# Patient Record
Sex: Male | Born: 1950 | Race: White | Hispanic: No | Marital: Married | Smoking: Former smoker
Health system: Southern US, Community
[De-identification: ages and names within clinical notes are randomized; demographics above are authoritative.]

## PROBLEM LIST (undated history)

## (undated) DIAGNOSIS — I10 Essential (primary) hypertension: Secondary | ICD-10-CM

## (undated) DIAGNOSIS — C801 Malignant (primary) neoplasm, unspecified: Secondary | ICD-10-CM

## (undated) DIAGNOSIS — K5790 Diverticulosis of intestine, part unspecified, without perforation or abscess without bleeding: Secondary | ICD-10-CM

## (undated) DIAGNOSIS — E785 Hyperlipidemia, unspecified: Secondary | ICD-10-CM

## (undated) DIAGNOSIS — N200 Calculus of kidney: Secondary | ICD-10-CM

## (undated) HISTORY — DX: Hyperlipidemia, unspecified: E78.5

## (undated) HISTORY — PX: LOW ANTERIOR BOWEL RESECTION: SUR1240

## (undated) HISTORY — DX: Essential (primary) hypertension: I10

## (undated) HISTORY — DX: Diverticulosis of intestine, part unspecified, without perforation or abscess without bleeding: K57.90

## (undated) HISTORY — DX: Calculus of kidney: N20.0

## (undated) HISTORY — DX: Malignant (primary) neoplasm, unspecified: C80.1

## (undated) HISTORY — PX: COLONOSCOPY: SHX174

---

## 1997-12-29 LAB — FECAL OCCULT BLOOD, GUAIAC: Fecal Occult Blood: NEGATIVE

## 1998-06-26 ENCOUNTER — Encounter: Payer: Self-pay | Admitting: Family Medicine

## 1998-06-26 LAB — CONVERTED CEMR LAB: PSA: 1.3 ng/mL

## 2003-08-29 HISTORY — PX: OTHER SURGICAL HISTORY: SHX169

## 2003-10-28 ENCOUNTER — Ambulatory Visit (HOSPITAL_COMMUNITY): Admission: RE | Admit: 2003-10-28 | Discharge: 2003-10-28 | Payer: Self-pay | Admitting: Internal Medicine

## 2003-11-13 ENCOUNTER — Inpatient Hospital Stay (HOSPITAL_COMMUNITY): Admission: RE | Admit: 2003-11-13 | Discharge: 2003-11-20 | Payer: Self-pay | Admitting: General Surgery

## 2003-11-13 ENCOUNTER — Encounter (INDEPENDENT_AMBULATORY_CARE_PROVIDER_SITE_OTHER): Payer: Self-pay | Admitting: Specialist

## 2003-12-08 ENCOUNTER — Ambulatory Visit (HOSPITAL_COMMUNITY): Admission: RE | Admit: 2003-12-08 | Discharge: 2003-12-08 | Payer: Self-pay | Admitting: Hematology and Oncology

## 2004-03-05 ENCOUNTER — Ambulatory Visit: Payer: Self-pay | Admitting: Hematology and Oncology

## 2004-03-29 ENCOUNTER — Ambulatory Visit (HOSPITAL_COMMUNITY): Admission: RE | Admit: 2004-03-29 | Discharge: 2004-03-29 | Payer: Self-pay | Admitting: Hematology and Oncology

## 2004-07-06 ENCOUNTER — Ambulatory Visit: Payer: Self-pay | Admitting: Hematology and Oncology

## 2004-10-07 ENCOUNTER — Ambulatory Visit: Payer: Self-pay | Admitting: Internal Medicine

## 2004-10-26 ENCOUNTER — Ambulatory Visit: Payer: Self-pay | Admitting: Internal Medicine

## 2004-10-26 ENCOUNTER — Encounter (INDEPENDENT_AMBULATORY_CARE_PROVIDER_SITE_OTHER): Payer: Self-pay | Admitting: *Deleted

## 2004-11-30 ENCOUNTER — Ambulatory Visit: Payer: Self-pay | Admitting: Hematology and Oncology

## 2005-04-01 ENCOUNTER — Ambulatory Visit (HOSPITAL_COMMUNITY): Admission: RE | Admit: 2005-04-01 | Discharge: 2005-04-01 | Payer: Self-pay | Admitting: Hematology and Oncology

## 2005-04-06 ENCOUNTER — Ambulatory Visit: Payer: Self-pay | Admitting: Hematology and Oncology

## 2005-10-03 ENCOUNTER — Ambulatory Visit: Payer: Self-pay | Admitting: Hematology and Oncology

## 2005-10-05 LAB — CBC WITH DIFFERENTIAL/PLATELET
Eosinophils Absolute: 0.2 10*3/uL (ref 0.0–0.5)
MONO#: 0.4 10*3/uL (ref 0.1–0.9)
NEUT#: 3.6 10*3/uL (ref 1.5–6.5)
Platelets: 269 10*3/uL (ref 145–400)
RBC: 4.89 10*6/uL (ref 4.20–5.71)
RDW: 12.6 % (ref 11.2–14.6)
WBC: 5.8 10*3/uL (ref 4.0–10.0)
lymph#: 1.7 10*3/uL (ref 0.9–3.3)

## 2005-10-05 LAB — COMPREHENSIVE METABOLIC PANEL
ALT: 23 U/L (ref 0–40)
Albumin: 4.1 g/dL (ref 3.5–5.2)
CO2: 25 mEq/L (ref 19–32)
Chloride: 104 mEq/L (ref 96–112)
Glucose, Bld: 142 mg/dL — ABNORMAL HIGH (ref 70–99)
Potassium: 3.9 mEq/L (ref 3.5–5.3)
Sodium: 139 mEq/L (ref 135–145)
Total Protein: 7 g/dL (ref 6.0–8.3)

## 2005-10-05 LAB — CEA: CEA: 1.1 ng/mL (ref 0.0–5.0)

## 2005-10-29 LAB — HM COLONOSCOPY

## 2005-11-08 ENCOUNTER — Ambulatory Visit: Payer: Self-pay | Admitting: Internal Medicine

## 2005-11-22 ENCOUNTER — Ambulatory Visit: Payer: Self-pay | Admitting: Internal Medicine

## 2006-03-28 ENCOUNTER — Ambulatory Visit: Payer: Self-pay | Admitting: Hematology and Oncology

## 2006-03-31 LAB — CBC WITH DIFFERENTIAL/PLATELET
BASO%: 0.7 % (ref 0.0–2.0)
EOS%: 1.9 % (ref 0.0–7.0)
LYMPH%: 31.6 % (ref 14.0–48.0)
MCH: 31.4 pg (ref 28.0–33.4)
MCHC: 35.5 g/dL (ref 32.0–35.9)
MONO#: 0.3 10*3/uL (ref 0.1–0.9)
RBC: 4.95 10*6/uL (ref 4.20–5.71)
WBC: 6.7 10*3/uL (ref 4.0–10.0)
lymph#: 2.1 10*3/uL (ref 0.9–3.3)

## 2006-03-31 LAB — COMPREHENSIVE METABOLIC PANEL
ALT: 33 U/L (ref 0–53)
AST: 19 U/L (ref 0–37)
CO2: 25 mEq/L (ref 19–32)
Chloride: 103 mEq/L (ref 96–112)
Creatinine, Ser: 1.04 mg/dL (ref 0.40–1.50)
Sodium: 140 mEq/L (ref 135–145)
Total Bilirubin: 0.5 mg/dL (ref 0.3–1.2)
Total Protein: 7.5 g/dL (ref 6.0–8.3)

## 2006-03-31 LAB — CEA: CEA: 1.2 ng/mL (ref 0.0–5.0)

## 2006-04-04 ENCOUNTER — Ambulatory Visit (HOSPITAL_COMMUNITY): Admission: RE | Admit: 2006-04-04 | Discharge: 2006-04-04 | Payer: Self-pay | Admitting: Hematology and Oncology

## 2006-09-29 ENCOUNTER — Ambulatory Visit: Payer: Self-pay | Admitting: Hematology and Oncology

## 2006-10-03 LAB — COMPREHENSIVE METABOLIC PANEL
ALT: 24 U/L (ref 0–53)
AST: 17 U/L (ref 0–37)
Alkaline Phosphatase: 79 U/L (ref 39–117)
BUN: 14 mg/dL (ref 6–23)
Calcium: 9.4 mg/dL (ref 8.4–10.5)
Creatinine, Ser: 1.09 mg/dL (ref 0.40–1.50)
Total Bilirubin: 0.8 mg/dL (ref 0.3–1.2)

## 2006-10-03 LAB — CBC WITH DIFFERENTIAL/PLATELET
BASO%: 1.1 % (ref 0.0–2.0)
Basophils Absolute: 0.1 10*3/uL (ref 0.0–0.1)
EOS%: 2.4 % (ref 0.0–7.0)
HCT: 43.4 % (ref 38.7–49.9)
HGB: 15.1 g/dL (ref 13.0–17.1)
LYMPH%: 26.6 % (ref 14.0–48.0)
MCH: 31.1 pg (ref 28.0–33.4)
MCHC: 34.8 g/dL (ref 32.0–35.9)
MCV: 89.3 fL (ref 81.6–98.0)
MONO%: 5.9 % (ref 0.0–13.0)
NEUT%: 64 % (ref 40.0–75.0)
Platelets: 232 10*3/uL (ref 145–400)
lymph#: 1.7 10*3/uL (ref 0.9–3.3)

## 2006-11-27 ENCOUNTER — Ambulatory Visit: Payer: Self-pay | Admitting: Internal Medicine

## 2006-11-27 DIAGNOSIS — M545 Low back pain: Secondary | ICD-10-CM

## 2006-12-02 ENCOUNTER — Emergency Department (HOSPITAL_COMMUNITY): Admission: EM | Admit: 2006-12-02 | Discharge: 2006-12-02 | Payer: Self-pay | Admitting: Emergency Medicine

## 2007-03-08 ENCOUNTER — Encounter: Payer: Self-pay | Admitting: Family Medicine

## 2007-03-08 DIAGNOSIS — C20 Malignant neoplasm of rectum: Secondary | ICD-10-CM

## 2007-03-08 DIAGNOSIS — Z87442 Personal history of urinary calculi: Secondary | ICD-10-CM | POA: Insufficient documentation

## 2007-03-08 DIAGNOSIS — Z85048 Personal history of other malignant neoplasm of rectum, rectosigmoid junction, and anus: Secondary | ICD-10-CM | POA: Insufficient documentation

## 2007-03-09 ENCOUNTER — Ambulatory Visit: Payer: Self-pay | Admitting: Family Medicine

## 2007-03-09 DIAGNOSIS — M25519 Pain in unspecified shoulder: Secondary | ICD-10-CM

## 2007-03-12 ENCOUNTER — Encounter: Admission: RE | Admit: 2007-03-12 | Discharge: 2007-03-12 | Payer: Self-pay | Admitting: Family Medicine

## 2007-03-27 ENCOUNTER — Ambulatory Visit: Payer: Self-pay | Admitting: Hematology and Oncology

## 2007-03-29 ENCOUNTER — Ambulatory Visit (HOSPITAL_COMMUNITY): Admission: RE | Admit: 2007-03-29 | Discharge: 2007-03-29 | Payer: Self-pay | Admitting: Hematology and Oncology

## 2007-03-29 LAB — CBC WITH DIFFERENTIAL/PLATELET
Basophils Absolute: 0 10*3/uL (ref 0.0–0.1)
EOS%: 1.8 % (ref 0.0–7.0)
Eosinophils Absolute: 0.1 10*3/uL (ref 0.0–0.5)
HCT: 45 % (ref 38.7–49.9)
HGB: 16 g/dL (ref 13.0–17.1)
MCH: 31.3 pg (ref 28.0–33.4)
MCV: 88.2 fL (ref 81.6–98.0)
MONO%: 7.4 % (ref 0.0–13.0)
NEUT#: 3.4 10*3/uL (ref 1.5–6.5)
NEUT%: 59.9 % (ref 40.0–75.0)
RDW: 12.1 % (ref 11.2–14.6)

## 2007-03-29 LAB — COMPREHENSIVE METABOLIC PANEL
Albumin: 4 g/dL (ref 3.5–5.2)
Alkaline Phosphatase: 74 U/L (ref 39–117)
BUN: 13 mg/dL (ref 6–23)
Calcium: 9.1 mg/dL (ref 8.4–10.5)
Chloride: 98 mEq/L (ref 96–112)
Creatinine, Ser: 1.12 mg/dL (ref 0.40–1.50)
Glucose, Bld: 114 mg/dL — ABNORMAL HIGH (ref 70–99)
Potassium: 4.3 mEq/L (ref 3.5–5.3)

## 2007-04-05 ENCOUNTER — Encounter: Payer: Self-pay | Admitting: Family Medicine

## 2007-04-06 ENCOUNTER — Telehealth: Payer: Self-pay | Admitting: Family Medicine

## 2007-05-07 ENCOUNTER — Ambulatory Visit: Payer: Self-pay | Admitting: Internal Medicine

## 2007-05-18 ENCOUNTER — Encounter: Payer: Self-pay | Admitting: Family Medicine

## 2007-06-20 ENCOUNTER — Ambulatory Visit: Payer: Self-pay | Admitting: Family Medicine

## 2008-03-27 ENCOUNTER — Ambulatory Visit: Payer: Self-pay | Admitting: Hematology and Oncology

## 2008-03-31 ENCOUNTER — Ambulatory Visit (HOSPITAL_COMMUNITY): Admission: RE | Admit: 2008-03-31 | Discharge: 2008-03-31 | Payer: Self-pay | Admitting: Hematology and Oncology

## 2008-03-31 LAB — CBC WITH DIFFERENTIAL/PLATELET
Basophils Absolute: 0 10*3/uL (ref 0.0–0.1)
Eosinophils Absolute: 0.1 10*3/uL (ref 0.0–0.5)
HGB: 15.5 g/dL (ref 13.0–17.1)
MCV: 90.1 fL (ref 81.6–98.0)
MONO%: 6.9 % (ref 0.0–13.0)
NEUT#: 3.5 10*3/uL (ref 1.5–6.5)
Platelets: 252 10*3/uL (ref 145–400)
RDW: 12.7 % (ref 11.2–14.6)

## 2008-03-31 LAB — COMPREHENSIVE METABOLIC PANEL
Albumin: 3.8 g/dL (ref 3.5–5.2)
CO2: 26 mEq/L (ref 19–32)
Calcium: 9.7 mg/dL (ref 8.4–10.5)
Glucose, Bld: 110 mg/dL — ABNORMAL HIGH (ref 70–99)
Sodium: 139 mEq/L (ref 135–145)
Total Bilirubin: 0.8 mg/dL (ref 0.3–1.2)
Total Protein: 6.9 g/dL (ref 6.0–8.3)

## 2008-04-11 ENCOUNTER — Encounter: Payer: Self-pay | Admitting: Internal Medicine

## 2008-04-11 ENCOUNTER — Encounter: Payer: Self-pay | Admitting: Family Medicine

## 2008-09-08 ENCOUNTER — Ambulatory Visit: Payer: Self-pay | Admitting: Family Medicine

## 2008-09-08 DIAGNOSIS — E785 Hyperlipidemia, unspecified: Secondary | ICD-10-CM

## 2008-09-12 LAB — CONVERTED CEMR LAB
ALT: 38 units/L (ref 0–53)
AST: 27 units/L (ref 0–37)
Albumin: 4.1 g/dL (ref 3.5–5.2)
Alkaline Phosphatase: 81 units/L (ref 39–117)
Basophils Relative: 0.2 % (ref 0.0–3.0)
Bilirubin, Direct: 0 mg/dL (ref 0.0–0.3)
CO2: 31 meq/L (ref 19–32)
Cholesterol: 334 mg/dL — ABNORMAL HIGH (ref 0–200)
Eosinophils Absolute: 0.1 10*3/uL (ref 0.0–0.7)
GFR calc non Af Amer: 81.31 mL/min (ref >60)
Glucose, Bld: 81 mg/dL (ref 70–99)
HCT: 46.8 % (ref 39.0–52.0)
Hemoglobin: 15.9 g/dL (ref 13.0–17.0)
MCHC: 34.1 g/dL (ref 30.0–36.0)
MCV: 91.9 fL (ref 78.0–100.0)
Monocytes Absolute: 0.5 10*3/uL (ref 0.1–1.0)
Neutrophils Relative %: 59.3 % (ref 43.0–77.0)
PSA: 1.89 ng/mL (ref 0.10–4.00)
Potassium: 4 meq/L (ref 3.5–5.1)
Sodium: 143 meq/L (ref 135–145)
Total Bilirubin: 0.7 mg/dL (ref 0.3–1.2)
Total CHOL/HDL Ratio: 8

## 2008-10-20 ENCOUNTER — Ambulatory Visit: Payer: Self-pay | Admitting: Family Medicine

## 2008-10-22 LAB — CONVERTED CEMR LAB
ALT: 35 units/L (ref 0–53)
AST: 29 units/L (ref 0–37)
Cholesterol: 241 mg/dL — ABNORMAL HIGH (ref 0–200)
Direct LDL: 156.6 mg/dL
HDL: 41.1 mg/dL (ref >39.00)
VLDL: 65 mg/dL — ABNORMAL HIGH (ref 0.0–40.0)

## 2008-10-27 ENCOUNTER — Ambulatory Visit: Payer: Self-pay | Admitting: Internal Medicine

## 2008-11-13 ENCOUNTER — Ambulatory Visit: Payer: Self-pay | Admitting: Internal Medicine

## 2009-01-12 ENCOUNTER — Ambulatory Visit: Payer: Self-pay | Admitting: Family Medicine

## 2009-01-13 LAB — CONVERTED CEMR LAB
AST: 26 units/L (ref 0–37)
Cholesterol: 332 mg/dL — ABNORMAL HIGH (ref 0–200)
Total CHOL/HDL Ratio: 8

## 2009-04-01 ENCOUNTER — Ambulatory Visit (HOSPITAL_BASED_OUTPATIENT_CLINIC_OR_DEPARTMENT_OTHER): Payer: 59 | Admitting: Hematology and Oncology

## 2009-04-03 ENCOUNTER — Ambulatory Visit (HOSPITAL_COMMUNITY): Admission: RE | Admit: 2009-04-03 | Discharge: 2009-04-03 | Payer: Self-pay | Admitting: Hematology and Oncology

## 2009-04-03 LAB — COMPREHENSIVE METABOLIC PANEL
AST: 27 U/L (ref 0–37)
Alkaline Phosphatase: 84 U/L (ref 39–117)
BUN: 10 mg/dL (ref 6–23)
Creatinine, Ser: 1.11 mg/dL (ref 0.40–1.50)
Total Bilirubin: 0.8 mg/dL (ref 0.3–1.2)

## 2009-04-03 LAB — CBC WITH DIFFERENTIAL/PLATELET
Basophils Absolute: 0 10*3/uL (ref 0.0–0.1)
EOS%: 1.4 % (ref 0.0–7.0)
HCT: 44.3 % (ref 38.4–49.9)
HGB: 15.4 g/dL (ref 13.0–17.1)
MCH: 31.6 pg (ref 27.2–33.4)
MCV: 90.7 fL (ref 79.3–98.0)
MONO%: 7.5 % (ref 0.0–14.0)
NEUT%: 64.4 % (ref 39.0–75.0)

## 2009-04-09 ENCOUNTER — Encounter: Payer: Self-pay | Admitting: Family Medicine

## 2009-07-08 ENCOUNTER — Ambulatory Visit: Payer: Self-pay | Admitting: Family Medicine

## 2010-03-19 ENCOUNTER — Other Ambulatory Visit: Payer: Self-pay | Admitting: Hematology and Oncology

## 2010-03-19 DIAGNOSIS — C20 Malignant neoplasm of rectum: Secondary | ICD-10-CM

## 2010-03-20 ENCOUNTER — Encounter: Payer: Self-pay | Admitting: Hematology and Oncology

## 2010-03-21 ENCOUNTER — Encounter: Payer: Self-pay | Admitting: Hematology and Oncology

## 2010-03-31 NOTE — Assessment & Plan Note (Signed)
Summary: SINUS SYMPTOMS   Vital Signs:  Patient profile:   60 year old male Temp:     97.7 degrees F oral Pulse rate:   68 / minute Pulse rhythm:   regular BP sitting:   142 / 90  (left arm) Cuff size:   regular  Vitals Entered By: Sydell Axon LPN (Jul 08, 2009 11:20 AM) CC: Sore throat, chest congestion , productive cough/light green, symptoms started 4 days ago, declined to weigh   History of Present Illness: Pt here for congestion from the neck down. He has no fever or chills, He hasn';t felt bad except chest congestion from five days ago and ST three days ago. No ear pain, no rhinitis, some cough with moinimal production light green production first thing in the morning, mild SOb, noi N/V. He has taken Dayquil and throat spray.  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 Take 1 Tablet By Mouth Once A Day 2)  Adult Aspirin Ec Low Strength 81 Mg  Tbec (Aspirin) .... Take 1 Tablet By Mouth Once A Day  Allergies: 1)  ! Lipitor 2)  ! Pravachol  Physical Exam  General:  Well-developed,well-nourished,in no acute distress; alert,appropriate and cooperative throughout examination, does not appear congested. Head:  normocephalic, atraumatic, and no abnormalities observed.  Sinuses NT. Eyes:  Conjunctiva clear bilaterally, no icterus. Ears:  External ear exam shows no significant lesions or deformities.  Otoscopic examination reveals clear canals, tympanic membranes are intact bilaterally without bulging, retraction, inflammation or discharge. Hearing is grossly normal  bilaterally. Nose:  External nasal examination shows no deformity or inflammation. Nasal mucosa are pink and moist without lesions or exudates. Mouth:  Oral mucosa and oropharynx without lesions or exudates.  Teeth in good repair. Neck:  supple with full rom and no masses or thyromegally, no JVD or carotid bruit  Lungs:  Normal respiratory effort, chest expands symmetrically. Lungs are clear to auscultation, no crackles or wheezes. Heart:  Normal rate and regular rhythm. S1 and S2 normal without gallop, murmur, click, rub or other extra sounds.   Impression & Recommendations:  Problem # 1:  URI (ICD-465.9) Assessment New  See instructions. His updated medication list for this problem includes:    Adult Aspirin Ec Low Strength 81 Mg Tbec (Aspirin) .Marland Kitchen... Take 1 tablet by mouth once a day    Dayquil Multi-symptom 30-325-10 Mg/23ml Liqd (Pseudoephedrine-apap-dm) .Marland Kitchen... As needed    Tessalon 200 Mg Caps (Benzonatate) ..... One tab by mouth three times a day  Instructed on symptomatic treatment. Call if symptoms persist or worsen.   Complete Medication List: 1)  Bl Essential One Daily Tabs (Multiple vitamin) .... Take 1 tablet by mouth once a day 2)  Adult Aspirin Ec Low Strength 81 Mg Tbec (Aspirin) .... Take 1 tablet by mouth once a day 3)  Dayquil Multi-symptom 30-325-10 Mg/63ml Liqd (Pseudoephedrine-apap-dm) .... As needed 4)  Zithromax Z-pak 250 Mg Tabs (Azithromycin) .... As dir 5)  Tessalon 200 Mg Caps (Benzonatate) .... One tab by mouth three times a day  Patient Instructions: 1)  Take Guaifenesin by [facility] and getting MUCOUS RELIEF EXPECTORANT (400mg ), take 11/2 tabs by mouth AM and NOON. 2)  Drink lots of fluids anytime taking Guaifenesin.  3)  Tyl ES 2 tabs by mouth three times a day. 4)  Gargle with warm salt water every half hour for two days. 5)  Keep lozenge in mouth 6)  Take Tessalon to blunt cough. 7)  Only take Zithromax if sxs  worsen. Prescriptions: TESSALON 200 MG CAPS (BENZONATATE) one tab by mouth three times a day  #30 x 0   Entered and Authorized by:   Shaune Leeks MD   Signed by:   Shaune Leeks MD on 07/08/2009   Method used:   [facility]        [facility]  [address] ([facility]ail[facility]       [address]       [place], [place]  [address]       Ph: [phone] or [phone]       Fax: [phone]   RxID:   [id number] ZITHROMAX Z-PAK 250 MG TABS (AZITHROMYCIN) as dir  #1 pak x 0   Entered and Authorized by:   Shaune Leeks MD   Signed by:   Shaune Leeks MD on 07/08/2009   Method used:   [facility]        [facility]  [address] (retail)       [address]       [place], [place]  [address]       Ph: [phone] or [phone]       Fax: [phone]   RxID:   [id number]   Current Allergies (reviewed today): ! LIPITOR ! PRAVACHOL

## 2010-03-31 NOTE — Letter (Signed)
[facility]   Imported By: Lanelle Bal 04/20/2009 07:52:14  _____________________________________________________________________  External Attachment:    Type:   Image     Comment:   External Document

## 2010-04-02 ENCOUNTER — Other Ambulatory Visit: Payer: Self-pay | Admitting: Hematology and Oncology

## 2010-04-02 ENCOUNTER — Ambulatory Visit (HOSPITAL_COMMUNITY)
Admission: RE | Admit: 2010-04-02 | Discharge: 2010-04-02 | Disposition: A | Discharge status: Home / Self Care | Payer: 59 | Source: Ambulatory Visit | Attending: Hematology and Oncology | Admitting: Hematology and Oncology

## 2010-04-02 ENCOUNTER — Ambulatory Visit: Payer: 59 | Admitting: Hematology and Oncology

## 2010-04-02 DIAGNOSIS — J984 Other disorders of lung: Secondary | ICD-10-CM | POA: Insufficient documentation

## 2010-04-02 DIAGNOSIS — M5137 Other intervertebral disc degeneration, lumbosacral region: Secondary | ICD-10-CM | POA: Insufficient documentation

## 2010-04-02 DIAGNOSIS — M51379 Other intervertebral disc degeneration, lumbosacral region without mention of lumbar back pain or lower extremity pain: Secondary | ICD-10-CM | POA: Insufficient documentation

## 2010-04-02 DIAGNOSIS — Z9049 Acquired absence of other specified parts of digestive tract: Secondary | ICD-10-CM | POA: Insufficient documentation

## 2010-04-02 DIAGNOSIS — N400 Enlarged prostate without lower urinary tract symptoms: Secondary | ICD-10-CM | POA: Insufficient documentation

## 2010-04-02 DIAGNOSIS — C20 Malignant neoplasm of rectum: Secondary | ICD-10-CM

## 2010-04-02 DIAGNOSIS — Z85048 Personal history of other malignant neoplasm of rectum, rectosigmoid junction, and anus: Secondary | ICD-10-CM

## 2010-04-02 LAB — CMP (CANCER CENTER ONLY)
ALT(SGPT): 37 U/L (ref 10–47)
AST: 28 U/L (ref 11–38)
Calcium: 9.3 mg/dL (ref 8.0–10.3)
Creat: 1 mg/dl (ref 0.6–1.2)
Glucose, Bld: 120 mg/dL — ABNORMAL HIGH (ref 73–118)
Potassium: 4.4 mEq/L (ref 3.3–4.7)
Total Bilirubin: 0.6 mg/dl (ref 0.20–1.60)

## 2010-04-02 LAB — CBC WITH DIFFERENTIAL/PLATELET
Basophils Absolute: 0.1 10*3/uL (ref 0.0–0.1)
EOS%: 2.8 % (ref 0.0–7.0)
Eosinophils Absolute: 0.2 10*3/uL (ref 0.0–0.5)
HCT: 46.3 % (ref 38.4–49.9)
HGB: 16 g/dL (ref 13.0–17.1)
LYMPH%: 32.3 % (ref 14.0–49.0)
MCH: 30.9 pg (ref 27.2–33.4)
MCV: 89.6 fL (ref 79.3–98.0)
MONO%: 7.9 % (ref 0.0–14.0)
NEUT#: 3.4 10*3/uL (ref 1.5–6.5)
NEUT%: 56 % (ref 39.0–75.0)
Platelets: 255 10*3/uL (ref 140–400)

## 2010-04-02 MED ORDER — IOHEXOL 300 MG/ML  SOLN
100.0000 mL | Freq: Once | INTRAMUSCULAR | Status: AC | PRN
  Administered 2010-04-02: 100 mL via INTRAVENOUS

## 2010-04-23 ENCOUNTER — Other Ambulatory Visit: Payer: Self-pay | Admitting: Hematology and Oncology

## 2010-04-23 ENCOUNTER — Encounter: Payer: Self-pay | Admitting: Family Medicine

## 2010-04-23 ENCOUNTER — Encounter (HOSPITAL_BASED_OUTPATIENT_CLINIC_OR_DEPARTMENT_OTHER): Payer: 59 | Admitting: Hematology and Oncology

## 2010-04-23 DIAGNOSIS — C20 Malignant neoplasm of rectum: Secondary | ICD-10-CM

## 2010-04-23 DIAGNOSIS — Z85048 Personal history of other malignant neoplasm of rectum, rectosigmoid junction, and anus: Secondary | ICD-10-CM

## 2010-05-11 NOTE — Letter (Signed)
Summary: [facility]  [facility]   Imported By: Lanelle Bal 05/03/2010 11:36:43  _____________________________________________________________________  External Attachment:    Type:   Image     Comment:   External Document

## 2010-07-13 NOTE — Assessment & Plan Note (Signed)
[name]                         GASTROENTEROLOGY OFFICE NOTE   NAME:[name], [name]                       MRN:          [id number]  DATE:05/07/2007                            DOB:          10-Jun-1950    REASON FOR CONSULTATION:  Rectal exam.   HISTORY:  This is a 60 year old white male with a history of  adenocarcinoma of the rectum, status post low anterior resection on  November 13, 2003.  His disease was stage I.  Followup to date shows no  evidence of recurrent disease.  His last complete colonoscopy was on  November 22, 2005.  Evaluation revealed diverticulosis, and a  diminutive colon polyp, as well as hemorrhoids.  Followup in 3 years  recommended.  The patient recently saw Dr. Dalene Reynolds on April 05, 2007.  At that time she stated the patient needed a rectal exam, so he is  referred here.  The patient denies abdominal pain, change in bowel  habits or bleeding.  He does have what sounds like an external  hemorrhoid tag which he finds a bit bothersome.   He has no known drug allergies.   CURRENT MEDICATIONS:  Aspirin, multivitamin, and Naproxen.   PAST MEDICAL HISTORY:  1. Hyperlipidemia.  2. Kidney stones.  3. Rectal cancer.   His only surgery is his low anterior resection.   FAMILY HISTORY:  Remarkable for father with colon cancer diagnosed in  his 24s.  He is with the [employer] of [employer].  He is married.   PHYSICAL EXAMINATION:  GENERAL:  Finds a well-appearing male in no acute  distress.  He is alert and oriented.  VITAL SIGNS:  Blood pressure is 128/82, heart rate is 66, weight is 199  pounds.  LUNGS:  Clear.  HEART:  Regular.  ABDOMEN:  Soft without tenderness, mass or hernia.  RECTAL:  Reveals large non-inflamed external hemorrhoid tag.  Stool is  firm, brown, hemoccult negative.  No mass or tenderness.  Prostate is  normal.   IMPRESSION:  1. History of rectal cancer, stage I, status post low  anterior      resection.  No evidence of recurrent disease.  2. History of internal and external hemorrhoids. Symptomatic external      tag.  3. Incidental diverticulosis.   RECOMMENDATIONS:  1. Keep plans for surveillance colonoscopy in September 2010.  2. The patient will call Dr. Derrell Reynolds regarding possible removal of the      external hemorrhoid tag.  3. Ongoing general medical care with Dr. Milinda Reynolds.     [name]. William Goodell, MD  Electronically Signed    JNP/MedQ  DD: 05/07/2007  DT: 05/07/2007  Job #: [id number]   cc:   [name], M.D.  [name]. [name], M.D.  William A. William Antis, MD

## 2010-07-16 NOTE — Op Note (Signed)
NAME:  [name], [name] NO.:  [id number]   MEDICAL RECORD NO.:  [id number]                   PATIENT TYPE:  INP   LOCATION:  0002                                 FACILITY:  [facility]   PHYSICIAN:  Angelia Mould. Derrell Lolling, M.D.             DATE OF BIRTH:  [birth year]   DATE OF PROCEDURE:  DATE OF DISCHARGE:                                 OPERATIVE REPORT   PREOPERATIVE DIAGNOSIS:  Carcinoma of the midrectum.   POSTOPERATIVE DIAGNOSIS:  Carcinoma of the midrectum.   OPERATION PERFORMED:  Low anterior resection of the rectum with  coloproctostomy (29 mm EEA stapler).   SURGEON:  Angelia Mould. Derrell Lolling, M.D.   FIRST ASSISTANT:  Carlester Kasparek. Zachery Dakins, M.D.   OPERATIVE INDICATION:  This is a 60 year old white man who noticed a slight  change in his bowel habits and was found to have Hemoccult-positive stools,  who was otherwise feeling well.  A colonoscopy showed a malignant-appearing  tumor at 11 cm from the anal verge and having a length of 4 cm.  The tumor  was not obstructing.  Biopsy showed adenocarcinoma.  A CT scan of the  abdomen and pelvis was unremarkable, no adenopathy or mass was seen.  I  performed a rigid proctoscopy in the office and found that the tumor was at  about 12-13 cm.  The patient underwent elective bowel prep at home and is  brought to the operating room electively for resection.   OPERATIVE FINDINGS:  The patient had a 4-5 cm palpable mass in the rectum  just at and below the peritoneal reflection.  This did not appear to have  invaded through the full thickness of the wall of the colon.  There was no  palpable adenopathy.  Both ureters were seen and were easily identified and  preserved.  The liver felt normal.  There was no ascites.  The small bowel  felt normal.   OPERATIVE TECHNIQUE:  Following the induction of general endotracheal  anesthesia, a Foley catheter was inserted.  A nasogastric tube was inserted.  The patient placed in a  modified dorsal lithotomy position.  The abdomen and  the perineum were prepped and draped in a sterile fashion.  A low midline  laparotomy incision was made.  The abdomen was entered and explored with  findings as described above.  Self-retaining retractors were placed.  I  mobilized the sigmoid colon from its lateral peritoneal attachments with  sharp dissection.  I isolated a portion of the mid- to distal sigmoid colon  for transection and cleaned this off and transected it with the GIA stapling  device.  I scored the mesentery directly back to the periaortic area and  then down the sides into the pelvis.  Some of the more superficial  mesenteric vessels were isolated, clamped, and divided and ligated with 2-0  silk ties.  The superior hemorrhoidal vessels were isolated, clamped,  divided,  and suture ligated with 2-0 silk suture ligature and then ligated  with 2-0 silk tie.  I identified both the left ureter and the right ureter  and made sure that the dissection was carried out just medial to these  structures.  I carried the dissection down into the hollow of the sacrum  using the Harmonic scalpel.  I divided some of the lateral middle  hemorrhoidal attachments with the Harmonic scalpel.  The peritoneum was then  scored with the electrocautery anteriorly to further mobilize the rectum up  out of the pelvis.  We continued this dissection down into the pelvis.  His  pelvis was quite narrow, and we had to take small steps to do this.  We  ultimately isolated the rectum and the rectal mesentery about 5 cm below the  tumor.  We cleaned the mesentery off of the rectum using the Harmonic  scalpel.  When we had the rectum completely skeletonized, we took a look and  made sure we knew where the distal end of the tumor was.  We transected the  rectum about 4-5 cm distal to the tumor using the Ethicon contour stapling  device.  This worked well.  I took the specimen to a side table and opened   the specimen.  I measured the distal margin, and it was 3.0 cm.  I felt this  was more than adequate.  The rectum and its attached mesentery was then sent  to the lab for routine histology.   I changed my gloves and irrigated out the pelvis.  There really was not much  bleeding at all at this point.  I took the sigmoid colon where it had been  transected, and I amputated the staple line.  A pursestring suture of 2-0  Prolene was placed in the mid- to distal sigmoid.  I measured with the  dilators and found that it would admit a 29 mm stapler.  I inserted the  anvil into the sigmoid colon end and tied the pursestring suture down, and  this worked well.   At this point Dr. Zachery Dakins went below and inspected the perianal area and  the rectal stump.  He dilated the anal sphincters gently.  He inserted a  proctoscope and insufflated with air, and there was no air leak from the  stump.  He then inserted the 29 mm EEA stapler.  Under direct vision I  positioned that along with his assistance into the pelvis.  Once this was  centered on the rectal stump, he opened the stapler and the spike passed  easily through the rectal wall.  I secured the anvil onto this.  I checked  for orientation to make sure there was no twisting.  Dr. Zachery Dakins then  closed the stapler, fired it, opened it, and removed the stapler.  We took  the two tissue doughnuts out of the stapler, and they were good, thick,  complete doughnuts.  We sent the distal doughnut and labeled it as distal  margin.   Dr. Zachery Dakins then performed a rigid proctoscopy and he said the staple line  looked good all the way around, there was no bleeding. We put some saline in  the pelvis and when we insufflated the rectum, there was no air leak.  We  sucked all the air out.   We then changed our instruments and gloves.  The pelvis was irrigated copiously with about 2 L of saline.  There was no bleeding whatsoever.  All  of the laparotomy pads  were removed.  We positioned the nasogastric tube.  The small bowel and omentum were returned to their anatomic positions.  The  midline fascia was closed with a running suture of #1 PDS and the skin  closed with skin staples.  Clean bandages were placed and the patient was  taken to the recovery room in stable condition.  Estimated blood loss was  about 300 mL.  Complications:  None.  Sponge, needle, and instrument counts  were correct.                                               Angelia Mould. Derrell Lolling, M.D.    HMI/MEDQ  D:  11/13/2003  T:  11/13/2003  Job:  [id number]   cc:   Marne A. Milinda Antis, M.D. [facility]   John N. Marina Goodell, M.D. [facility]

## 2010-07-16 NOTE — Discharge Summary (Signed)
NAME:  [name], [name] NO.:  [id number]   MEDICAL RECORD NO.:  [id number]          PATIENT TYPE:  INP   LOCATION:  0342                         [facility]AC[facility]  [facility]   PHYSICIAN:  Angelia Mould. Derrell Lolling, M.D.DATE OF BIRTH:  11/15/1950   DATE OF ADMISSION:  11/13/2003  DATE OF DISCHARGE:  11/20/2003                                 DISCHARGE SUMMARY   FINAL DIAGNOSES:  Adenocarcinoma of the rectum, stage T2, N0.   OPERATION PERFORMED:  Low anterior resection of rectum with primary  anastomosis, November 13, 2003.   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  He noticed some change in his stool with change in consistency and  mucus.  He was found to have Hemoccult positive stools.  He has a family  history of colon cancer in his father. He underwent a colonoscopy on October 24, 2003 and there was a malignant appearing tumor at 11 cm and this  appeared to be circumferential but not obstructing.  The biopsy showed  invasive adenocarcinoma.  A CT scan of the abdomen and pelvis was also  performed and everything looked fine. There was no large tumor mass.  He was  evaluated for consideration of elective resection.   PHYSICAL EXAMINATION:  GENERAL:  Extremely healthy appearing thin, muscular  gentleman in no distress.  NECK:  Revealed no adenopathy or mass.  LUNGS:  Clear to auscultation.  HEART:  Regular rate and rhythm, no murmur.  ABDOMEN:  Soft and nontender, no palpable mass.  Liver and spleen not  enlarged.   A rigid proctoscopy was carried out which revealed the lower edge of the  tumor to be at about 12-13 cm.  I did not feel that he required mandatory  preop radiation therapy.   Surgery was discussed. He was brought to the hospital electively.   HOSPITAL COURSE:  The patient was taken to the operating room electively on  November 13, 2003.  He underwent exploration and a low anterior resection.  Apparent that his tumor was  just at and slightly below the peritoneal  reflection but was quite mobile, it was not that large.  We resected this  and performed anastomosis with a 29 mm EEA stapler.  The surgery was  uneventful.   The pathology report showed an invasive moderately differentiated  adenocarcinoma which invaded the muscularis propria and extended close but  not into the pericolonic adipose tissue. Margins of resection were free.  The tumor size was 5.9 cm in maximal dimension. Twenty lymph nodes were  examined and all 20 lymph nodes were negative. This was staged as a T2, N0  tumor.   Postoperatively, the patient did well.  He was advised of his pathology  report that was discussed with him. He was seen briefly in the hospital by  Dr. Arlan Organ of the medical oncology group and arrangements were made  for outpatient followup.   The patient progressed slowly but steadily in his diet and activities.  Nasogastric tube and Foley catheter were removed on the second postoperative  day and he  did well. He progressed slowly in his diet and activities and  resumed bowel function.   He was ready to go home on November 20, 2003. At that time, he had had  bowel movements and was tolerating a diet and wasn't having any cramps or  abdominal distention. His abdominal exam was unremarkable and the wound was  healing well. He was given a prescription for Vicodin for pain. He was asked  to followup with me in the office in about 10-14 days.  We took the staples  out of his wound and Steri-Stripped his wound.      HMI/MEDQ  D:  12/04/2003  T:  12/04/2003  Job:  [id number]   cc:   Marne A. Milinda Antis, M.D. [facility]   John N. Marina Goodell, M.D. [facility]

## 2010-12-09 LAB — URINALYSIS, ROUTINE W REFLEX MICROSCOPIC
Nitrite: NEGATIVE
Protein, ur: NEGATIVE
Specific Gravity, Urine: 1.021
Urobilinogen, UA: 1

## 2011-03-09 ENCOUNTER — Encounter: Payer: Self-pay | Admitting: Family Medicine

## 2011-03-09 ENCOUNTER — Ambulatory Visit (INDEPENDENT_AMBULATORY_CARE_PROVIDER_SITE_OTHER): Payer: 59 | Admitting: Family Medicine

## 2011-03-09 DIAGNOSIS — J111 Influenza due to unidentified influenza virus with other respiratory manifestations: Secondary | ICD-10-CM

## 2011-03-09 MED ORDER — OSELTAMIVIR PHOSPHATE 75 MG PO CAPS: 75.0000 mg | ORAL_CAPSULE | Freq: Two times a day (BID) | ORAL | Status: AC

## 2011-03-09 NOTE — Progress Notes (Signed)
SUBJECTIVE:  [name] is a 61 y.o. male who complains of coryza, myalgias and fever, TMax 102 for 3 days. He denies a history of chest pain, shortness of breath and vomiting and denies a history of asthma. Patient denies smoke cigarettes.   He did receive his flu shot this year.  OBJECTIVE: BP 138/80  Pulse 110  Temp(Src) 98 F (36.7 C) (Oral)  Wt 194 lb 4 oz (88.111 kg)  He appears well, vital signs are as noted. Ears normal.  Throat and pharynx normal.  Neck supple. No adenopathy in the neck. Nose is congested. Sinuses non tender. The chest is clear, without wheezes or rales.  ASSESSMENT:  influenza  PLAN: Tamiflu 75 mg twice daily x 5 days. Symptomatic therapy suggested: push fluids, rest and return office visit prn if symptoms persist or worsen. Lack of antibiotic effectiveness discussed with him. Call or return to clinic prn if these symptoms worsen or fail to improve as anticipated.

## 2011-03-12 ENCOUNTER — Telehealth: Payer: Self-pay | Admitting: Hematology and Oncology

## 2011-03-12 NOTE — Telephone Encounter (Signed)
S/w the pt and he is aware of his feb 2013 appts °

## 2011-04-15 ENCOUNTER — Other Ambulatory Visit: Payer: 59 | Admitting: Lab

## 2011-04-15 ENCOUNTER — Ambulatory Visit (HOSPITAL_COMMUNITY)
Admission: RE | Admit: 2011-04-15 | Discharge: 2011-04-15 | Disposition: A | Discharge status: Home / Self Care | Payer: 59 | Source: Ambulatory Visit | Attending: Hematology and Oncology | Admitting: Hematology and Oncology

## 2011-04-15 DIAGNOSIS — R918 Other nonspecific abnormal finding of lung field: Secondary | ICD-10-CM | POA: Insufficient documentation

## 2011-04-15 DIAGNOSIS — K4090 Unilateral inguinal hernia, without obstruction or gangrene, not specified as recurrent: Secondary | ICD-10-CM | POA: Insufficient documentation

## 2011-04-15 DIAGNOSIS — Z9049 Acquired absence of other specified parts of digestive tract: Secondary | ICD-10-CM | POA: Insufficient documentation

## 2011-04-15 DIAGNOSIS — I700 Atherosclerosis of aorta: Secondary | ICD-10-CM | POA: Insufficient documentation

## 2011-04-15 DIAGNOSIS — N400 Enlarged prostate without lower urinary tract symptoms: Secondary | ICD-10-CM | POA: Insufficient documentation

## 2011-04-15 DIAGNOSIS — K5730 Diverticulosis of large intestine without perforation or abscess without bleeding: Secondary | ICD-10-CM | POA: Insufficient documentation

## 2011-04-15 DIAGNOSIS — C20 Malignant neoplasm of rectum: Secondary | ICD-10-CM

## 2011-04-15 DIAGNOSIS — Z980 Intestinal bypass and anastomosis status: Secondary | ICD-10-CM | POA: Insufficient documentation

## 2011-04-15 DIAGNOSIS — I2510 Atherosclerotic heart disease of native coronary artery without angina pectoris: Secondary | ICD-10-CM | POA: Insufficient documentation

## 2011-04-15 LAB — CBC WITH DIFFERENTIAL/PLATELET
BASO%: 0.3 % (ref 0.0–2.0)
Basophils Absolute: 0 10*3/uL (ref 0.0–0.1)
EOS%: 2 % (ref 0.0–7.0)
MCH: 30.5 pg (ref 27.2–33.4)
MCHC: 34.2 g/dL (ref 32.0–36.0)
MCV: 89.4 fL (ref 79.3–98.0)
MONO%: 6.3 % (ref 0.0–14.0)
RBC: 5.08 10*6/uL (ref 4.20–5.82)
RDW: 12.7 % (ref 11.0–14.6)

## 2011-04-15 LAB — CMP (CANCER CENTER ONLY)
ALT(SGPT): 38 U/L (ref 10–47)
AST: 28 U/L (ref 11–38)
BUN, Bld: 14 mg/dL (ref 7–22)
Calcium: 9 mg/dL (ref 8.0–10.3)
Chloride: 100 mEq/L (ref 98–108)
Creat: 1 mg/dl (ref 0.6–1.2)
Total Bilirubin: 0.6 mg/dl (ref 0.20–1.60)

## 2011-04-15 MED ORDER — IOHEXOL 300 MG/ML  SOLN
100.0000 mL | Freq: Once | INTRAMUSCULAR | Status: AC | PRN
  Administered 2011-04-15: 100 mL via INTRAVENOUS

## 2011-04-22 ENCOUNTER — Telehealth: Payer: Self-pay | Admitting: Hematology and Oncology

## 2011-04-22 ENCOUNTER — Ambulatory Visit (HOSPITAL_BASED_OUTPATIENT_CLINIC_OR_DEPARTMENT_OTHER): Payer: 59 | Admitting: Hematology and Oncology

## 2011-04-22 VITALS — BP 142/89 | HR 87 | Temp 98.1°F | Wt 196.5 lb

## 2011-04-22 DIAGNOSIS — Z85048 Personal history of other malignant neoplasm of rectum, rectosigmoid junction, and anus: Secondary | ICD-10-CM

## 2011-04-22 DIAGNOSIS — C20 Malignant neoplasm of rectum: Secondary | ICD-10-CM

## 2011-04-22 NOTE — Progress Notes (Signed)
CC:   [name]. [name], M.D. [name]. William Goodell, MD  IDENTIFYING STATEMENT:  The patient is a 61 year old man with rectal cancer who presents for followup.  INTERVAL HISTORY:  [name] has had no concerns since his last followup visit a year ago.  Of note, his last colonoscopy was on 11/13/2008 that was essentially unremarkable.  He has had no loss in weight.  He has had no change in bowel function.  He has had no rectal bleeding.  I reviewed his CT scan of the chest, abdomen and pelvis on 04/15/2011. There was a 6 mm ground glass nodule in the posterior right upper lobe that remained stable with no suspicious pulmonary nodules or adenopathy. The abdomen and pelvis showed no evidence of metastatic disease.  MEDICATIONS:  Reviewed and updated.  Past medical history, family history and social history unchanged.  REVIEW OF SYSTEMS:  A 10 point review of systems negative.  PHYSICAL EXAMINATION:  General:  The patient is a well-appearing, well- nourished man in no distress.  Vitals:  Pulse 87, blood pressure 142/89, temperature 98.1, respirations 20, weight 196.5 pounds.  HEENT:  Head is atraumatic, normocephalic.  Sclerae anicteric.  Pupils equal, round, reactive to light.  Mouth moist without ulceration, thrush or lesions. Neck:  Supple without adenopathy.  Chest:  Demonstrates good entry bilaterally and clear to both percussion and auscultation. Cardiovascular:  Reveals first and second heart sounds present without added sounds or murmurs.  Abdomen:  Soft, nontender.  No masses.  No hepatomegaly.  Bowel sounds present.  Extremities:  No edema.  Pulses present and symmetrical.  CNS:  Nonfocal.  Lymph nodes:  No adenopathy.  LAB DATA:  On 04/15/2011 white cell count 7.1, hemoglobin 15.5, hematocrit 45.4, platelets 235.  Sodium 146, potassium 4.5, chloride 100, CO2 30, BUN 14, creatinine 1, glucose 116, T bilirubin 0.6, alkaline phosphatase 97, AST 28, ALT 38, calcium 9.  CEA 0.5.   Results of CTs as in interval history.  IMPRESSION AND PLAN:  [name] is a 61 year old man who is status post low anterior resection for stage I (T2 N0 M0) moderately differentiated adenocarcinoma of the rectum on 11/13/2003.  None of the 20 lymph nodes sampled had evidence of tumor.  His current exam and CT scans indicate no evidence of recurrence.  He is 6 years post diagnosis and will continue surveillance on an annual basis.  He follows up in a year's time with a CT scan and blood work.  He is due for colonoscopy in October of this year.    ______________________________ Laurice Record, M.D. LIO/MEDQ  D:  04/22/2011  T:  04/22/2011  Job:  [id number]

## 2011-04-22 NOTE — Progress Notes (Signed)
This office note has been dictated.

## 2011-04-22 NOTE — Telephone Encounter (Signed)
Called [facility], talked to [facility] to schedule colonoscopy, she informed me that pt will be called for the appt on September and pt aware.

## 2011-06-14 ENCOUNTER — Ambulatory Visit (INDEPENDENT_AMBULATORY_CARE_PROVIDER_SITE_OTHER): Payer: 59 | Admitting: Family Medicine

## 2011-06-14 ENCOUNTER — Encounter: Payer: Self-pay | Admitting: Family Medicine

## 2011-06-14 VITALS — BP 130/80 | HR 61 | Temp 97.8°F | Ht 72.0 in | Wt 193.0 lb

## 2011-06-14 DIAGNOSIS — C20 Malignant neoplasm of rectum: Secondary | ICD-10-CM

## 2011-06-14 DIAGNOSIS — R7303 Prediabetes: Secondary | ICD-10-CM | POA: Insufficient documentation

## 2011-06-14 DIAGNOSIS — Z125 Encounter for screening for malignant neoplasm of prostate: Secondary | ICD-10-CM

## 2011-06-14 DIAGNOSIS — R739 Hyperglycemia, unspecified: Secondary | ICD-10-CM

## 2011-06-14 DIAGNOSIS — E785 Hyperlipidemia, unspecified: Secondary | ICD-10-CM

## 2011-06-14 DIAGNOSIS — R7309 Other abnormal glucose: Secondary | ICD-10-CM

## 2011-06-14 DIAGNOSIS — Z0000 Encounter for general adult medical examination without abnormal findings: Secondary | ICD-10-CM

## 2011-06-14 LAB — LIPID PANEL
Cholesterol: 332 mg/dL — ABNORMAL HIGH (ref 0–200)
Triglycerides: 231 mg/dL — ABNORMAL HIGH (ref 0.0–149.0)
VLDL: 46.2 mg/dL — ABNORMAL HIGH (ref 0.0–40.0)

## 2011-06-14 LAB — HEMOGLOBIN A1C: Hgb A1c MFr Bld: 5.9 % (ref 4.6–6.5)

## 2011-06-14 NOTE — Assessment & Plan Note (Signed)
Doing well with recent clear CT scans Will be due for colonosc in the fall

## 2011-06-14 NOTE — Patient Instructions (Signed)
If you are interested in a shingles/zoster vaccine - call your insurance to check on coverage,( you should not get it within 1 month of other vaccines) , then call us for a prescription  for it to take to a pharmacy that gives the shot  For cholesterol Avoid red meat/ fried foods/ egg yolks/ fatty breakfast meats/ butter, cheese and high fat dairy/ and shellfish   Labs today - cholesterol/ sugar/ psa  We may want to discuss non statin medications in the future  Keep up the exercise

## 2011-06-14 NOTE — Assessment & Plan Note (Signed)
slt high fasting sugar 116 in feb Check a1c  Disc low glycemic diet

## 2011-06-14 NOTE — Progress Notes (Signed)
Subjective:    Patient ID: [name], male    DOB: 11-09-1950, 61 y.o.   MRN: [id number]  HPI Here for health maintenance exam and to review chronic medical problems   Nothing new going on medically  Taking care of himself   bp is 130/80 Wt is down 3 lb with bmi of 26  Diet- is moderately healthy , some junk food  Exercise- is regular - stretches and sit ups and walking     Chemistry      Component Value Date/Time   NA 146* 04/15/2011 0808   NA 138 04/03/2009 0811   K 4.5 04/15/2011 0808   K 4.1 04/03/2009 0811   CL 100 04/15/2011 0808   CL 101 04/03/2009 0811   CO2 30 04/15/2011 0808   CO2 31 04/03/2009 0811   BUN 14 04/15/2011 0808   BUN 10 04/03/2009 0811   CREATININE 1.0 04/15/2011 0808   CREATININE 1.11 04/03/2009 0811      Component Value Date/Time   CALCIUM 9.0 04/15/2011 0808   CALCIUM 9.2 04/03/2009 0811   ALKPHOS 97* 04/15/2011 0808   ALKPHOS 84 04/03/2009 0811   AST 28 04/15/2011 0808   AST 27 04/03/2009 0811   ALT 36 04/03/2009 0811   BILITOT 0.60 04/15/2011 0808   BILITOT 0.8 04/03/2009 0811       Last labs  Alk phos 97 Gluc 116-thinks he was fasting , no thirst or excessive urination Hx of rectal ca-- is doing well and cancer free  colonosc 9/10 with 3 y f/u --is aware - due this fall  cea was .5  Lipids Lab Results  Component Value Date   CHOL 332* 01/12/2009   HDL 41.20 01/12/2009   LDLDIRECT 235.2 01/12/2009   TRIG 313.0* 01/12/2009   CHOLHDL 8 01/12/2009   does not expect it to be different  Does not watch diet for cholesterol  Does eat fatty foods  Does eat occ steak , occ bacon  Declines cholesterol med - it made his liver enz go up and made him moody  Sister has high chol    Prostate No problems with urination at all  No nocturia  Lab Results  Component Value Date   PSA 1.89 09/08/2008   PSA 1.3 06/26/1998   will do DRE today   Zoster status - never had it or vaccine ,  ? Chicken pox   Flu shot - ogt it in the fall   Patient Active Problem List    Diagnoses  . ADENOCARCINOMA, RECTUM  . HYPERLIPIDEMIA  . BACK PAIN, LUMBAR  . NEPHROLITHIASIS, HX OF  . Hyperglycemia  . Prostate cancer screening  . Elevated PSA   Past Medical History  Diagnosis Date  . Nephrolithiasis   . Diverticulosis   . Cancer     rectal   Past Surgical History  Procedure Date  . Enlarged prostate 08/2003    on CT scan  . Low anterior bowel resection    History  Substance Use Topics  . Smoking status: Never Smoker   . Smokeless tobacco: Not on file  . Alcohol Use: Not on file   Family History  Problem Relation Age of Onset  . Cancer Father 22    colon  . Cancer Brother     colon   Allergies  Allergen Reactions  . Atorvastatin     REACTION: elevated transamines  . Pravastatin Sodium     REACTION: mood problems/irritability/personality change   Current Outpatient Prescriptions on File  Prior to Visit  Medication Sig Dispense Refill  . aspirin EC 81 MG tablet Take 81 mg by mouth daily.      . Multiple Vitamin (MULTIVITAMIN) tablet Take 1 tablet by mouth daily.      . niacin (NIASPAN) 500 MG CR tablet Take 1 by mouth at bedtime 1/2 hour after taking baby aspirin  30 tablet  11         Review of Systems Review of Systems  Constitutional: Negative for fever, appetite change, fatigue and unexpected weight change.  Eyes: Negative for pain and visual disturbance.  Respiratory: Negative for cough and shortness of breath.   Cardiovascular: Negative for cp or palpitations    Gastrointestinal: Negative for nausea, diarrhea and constipation.  Genitourinary: Negative for urgency and frequency.  Skin: Negative for pallor or rash   Neurological: Negative for weakness, light-headedness, numbness and headaches.  Hematological: Negative for adenopathy. Does not bruise/bleed easily.  Psychiatric/Behavioral: Negative for dysphoric mood. The patient is not nervous/anxious.          Objective:   Physical Exam  Constitutional: He appears  well-developed and well-nourished. No distress.  HENT:  Head: Normocephalic and atraumatic.  Right Ear: External ear normal.  Left Ear: External ear normal.  Nose: Nose normal.  Mouth/Throat: Oropharynx is clear and moist.  Eyes: Conjunctivae and EOM are normal. Pupils are equal, round, and reactive to light. No scleral icterus.  Neck: Normal range of motion. Neck supple. No JVD present. Carotid bruit is not present. No thyromegaly present.  Cardiovascular: Normal rate, regular rhythm, normal heart sounds and intact distal pulses.  Exam reveals no gallop.   Pulmonary/Chest: Effort normal and breath sounds normal. No respiratory distress. He has no wheezes.  Abdominal: Soft. Bowel sounds are normal. He exhibits no distension, no abdominal bruit and no mass. There is no tenderness.  Genitourinary: Rectum normal. Rectal exam shows no tenderness. Guaiac negative stool.       Prostate is large- normal - no change from prev exam Symmetric, nontender, smooth   Musculoskeletal: Normal range of motion. He exhibits no edema and no tenderness.  Lymphadenopathy:    He has no cervical adenopathy.  Neurological: He is alert. He has normal reflexes. No cranial nerve deficit. He exhibits normal muscle tone. Coordination normal.  Skin: Skin is warm and dry. No rash noted. No erythema. No pallor.  Psychiatric: He has a normal mood and affect.          Assessment & Plan:

## 2011-06-14 NOTE — Assessment & Plan Note (Signed)
Very high LDL in past and pt does not tolerate statins Rev low sat fat diet in detail Lab today ? Consider non statin opt Disc risks of high chol

## 2011-06-14 NOTE — Assessment & Plan Note (Signed)
Stable DRE and no sympt psa today

## 2011-06-16 ENCOUNTER — Telehealth: Payer: Self-pay | Admitting: Family Medicine

## 2011-06-16 DIAGNOSIS — R972 Elevated prostate specific antigen [PSA]: Secondary | ICD-10-CM | POA: Insufficient documentation

## 2011-06-16 DIAGNOSIS — Z0000 Encounter for general adult medical examination without abnormal findings: Secondary | ICD-10-CM | POA: Insufficient documentation

## 2011-06-16 DIAGNOSIS — Z0001 Encounter for general adult medical examination with abnormal findings: Secondary | ICD-10-CM | POA: Insufficient documentation

## 2011-06-16 DIAGNOSIS — E785 Hyperlipidemia, unspecified: Secondary | ICD-10-CM

## 2011-06-16 MED ORDER — NIACIN ER (ANTIHYPERLIPIDEMIC) 500 MG PO TBCR: EXTENDED_RELEASE_TABLET | ORAL | Status: DC

## 2011-06-16 NOTE — Telephone Encounter (Signed)
Message copied by Judy Pimple on Thu Jun 16, 2011  8:21 AM ------      Message from: [place], Everette Rank      Created: Wed Jun 15, 2011  5:17 PM       Patient advised as instructed via telephone, he stated that he is willing to see  Urologist, willing to take Niacin but not Welchol or Zetia, and he will work on diet/exericse.

## 2011-06-16 NOTE — Telephone Encounter (Signed)
Will refer to urology  Will try niacin (niaspan)- tell him to take it at bedtime 1/2 hour after his baby aspirin -- side eff poss is flushing , if this is intolerable, stop it and let me know  ( sent to his pharmacy electronically) Fasting lab in 6 wk please

## 2011-06-16 NOTE — Assessment & Plan Note (Signed)
Reviewed health habits including diet and exercise and skin cancer prevention Also reviewed health mt list, fam hx and immunizations  Wellness labs today 

## 2011-06-16 NOTE — Telephone Encounter (Signed)
Patient advised as instructed via telephone, he will wait to hear from [facility] regarding referral.  Lab appt scheduled.

## 2011-06-22 ENCOUNTER — Telehealth: Payer: Self-pay

## 2011-06-22 NOTE — Telephone Encounter (Signed)
Stop the niaspan before doing anything else - and let me know next week if dizziness gets better  Then I will answer his question about red yeast rice

## 2011-06-22 NOTE — Telephone Encounter (Signed)
Pt experiencing dizziness especially in mornings and dizziness is worse after pt exercises. No flushing sensation. Pharmacist recommended pt increase ASA 81 mg to twice a day but pt said that has not helped dizziness. Pt also wants Dr Royden Purl opinion of red yeast rice for lowering cholesterol. Pt uses [facility] and can be reached at [phone].

## 2011-06-22 NOTE — Telephone Encounter (Signed)
Patient advised as instructed via telephone. 

## 2011-07-22 ENCOUNTER — Other Ambulatory Visit (INDEPENDENT_AMBULATORY_CARE_PROVIDER_SITE_OTHER): Payer: 59

## 2011-07-22 DIAGNOSIS — E785 Hyperlipidemia, unspecified: Secondary | ICD-10-CM

## 2011-07-22 LAB — LIPID PANEL
Cholesterol: 292 mg/dL — ABNORMAL HIGH (ref 0–200)
Total CHOL/HDL Ratio: 7
Triglycerides: 296 mg/dL — ABNORMAL HIGH (ref 0.0–149.0)
VLDL: 59.2 mg/dL — ABNORMAL HIGH (ref 0.0–40.0)

## 2011-07-22 LAB — ALT: ALT: 31 U/L (ref 0–53)

## 2011-07-26 ENCOUNTER — Encounter: Payer: Self-pay | Admitting: *Deleted

## 2011-11-30 ENCOUNTER — Telehealth: Payer: Self-pay | Admitting: *Deleted

## 2011-11-30 ENCOUNTER — Encounter: Payer: Self-pay | Admitting: Internal Medicine

## 2011-11-30 NOTE — Telephone Encounter (Signed)
Pt called wanting to know about how to set up colonoscopy.   Informed pt to contact Dr. Marina Goodell to obtain appt for colonoscopy when the time is due.   Instructed pt to have procedure done before his next appt with Dr. Dalene Carrow in Feb, 2014.   Pt voiced understanding.

## 2011-12-02 ENCOUNTER — Encounter: Payer: Self-pay | Admitting: Internal Medicine

## 2012-01-13 ENCOUNTER — Ambulatory Visit (AMBULATORY_SURGERY_CENTER): Payer: 59 | Admitting: *Deleted

## 2012-01-13 ENCOUNTER — Encounter: Payer: Self-pay | Admitting: Internal Medicine

## 2012-01-13 VITALS — Ht 72.0 in | Wt 191.0 lb

## 2012-01-13 DIAGNOSIS — Z1211 Encounter for screening for malignant neoplasm of colon: Secondary | ICD-10-CM

## 2012-01-13 MED ORDER — MOVIPREP 100 G PO SOLR: ORAL | Status: DC

## 2012-01-24 ENCOUNTER — Ambulatory Visit (AMBULATORY_SURGERY_CENTER): Payer: 59 | Admitting: Internal Medicine

## 2012-01-24 ENCOUNTER — Encounter: Payer: Self-pay | Admitting: Internal Medicine

## 2012-01-24 VITALS — BP 132/79 | HR 55 | Temp 97.4°F | Resp 14 | Ht 72.0 in | Wt 191.0 lb

## 2012-01-24 DIAGNOSIS — Z85038 Personal history of other malignant neoplasm of large intestine: Secondary | ICD-10-CM

## 2012-01-24 DIAGNOSIS — Z1211 Encounter for screening for malignant neoplasm of colon: Secondary | ICD-10-CM

## 2012-01-24 MED ORDER — SODIUM CHLORIDE 0.9 % IV SOLN: 500.0000 mL | INTRAVENOUS | Status: DC

## 2012-01-24 NOTE — Progress Notes (Signed)
1025  AO x3 pleased with MAC report to April RN

## 2012-01-24 NOTE — Op Note (Signed)
[facility] [address] [place], [address]   COLONOSCOPY PROCEDURE REPORT  PATIENT: William, Reynolds  MR#: [id number] BIRTHDATE: 07/23/1950 , 61  yrs. old GENDER: Male ENDOSCOPIST: Roxy Cedar, MD REFERRED OZ:[id number] Program Recall PROCEDURE DATE:  01/24/2012 PROCEDURE:   Colonoscopy, surveillance ASA CLASS:   Class II INDICATIONS:High risk patient with personal history of rectal cancer - index 2005 s/p LAR; f/u 2006,2007,2010. MEDICATIONS: MAC sedation, administered by CRNA and propofol (Diprivan) 250mg  IV  DESCRIPTION OF PROCEDURE:   After the risks benefits and alternatives of the procedure were thoroughly explained, informed consent was obtained.  A digital rectal exam revealed no abnormalities of the rectum.   The LB [id number]  endoscope was introduced through the anus and advanced to the cecum, which was identified by both the appendix and ileocecal valve. No adverse events experienced.   The quality of the prep was excellent, using MoviPrep  The instrument was then slowly withdrawn as the colon was fully examined.      COLON FINDINGS: Moderate diverticulosis was noted The finding was in the left colon Normal anastomosis at 15cm.   The colon mucosa was otherwise normal. No polyps or cancers.  Retroflexed views revealed internal hemorrhoids. The time to cecum=1 minutes 29 seconds. Withdrawal time=10 minutes 11 seconds.  The scope was withdrawn and the procedure completed. COMPLICATIONS: There were no complications.  ENDOSCOPIC IMPRESSION: 1.   Moderate diverticulosis was noted in the left colon 2.   The colon mucosa was otherwise normal  RECOMMENDATIONS: 1. Follow up colonoscopy in 5 years   eSigned:  Roxy Cedar, MD 01/24/2012 10:31 AM   cc: Judy Pimple, MD and The Patient

## 2012-01-24 NOTE — Progress Notes (Signed)
Patient did not experience any of the following events: a burn prior to discharge; a fall within the facility; wrong site/side/patient/procedure/implant event; or a hospital transfer or hospital admission upon discharge from the facility. (G8907) Patient did not have preoperative order for IV antibiotic SSI prophylaxis. (G8918)   Charted by April Mirts RN 

## 2012-01-24 NOTE — Patient Instructions (Addendum)

## 2012-01-25 ENCOUNTER — Telehealth: Payer: Self-pay

## 2012-01-25 NOTE — Telephone Encounter (Signed)
  Follow up Call-  Call back number 01/24/2012  Post procedure Call Back phone  # [phone]  Permission to leave phone message Yes     Patient questions:  Do you have a fever, pain , or abdominal swelling? no Pain Score  0 *  Have you tolerated food without any problems? yes  Have you been able to return to your normal activities? yes  Do you have any questions about your discharge instructions: Diet   no Medications  no Follow up visit  no  Do you have questions or concerns about your Care? no  Actions: * If pain score is 4 or above: No action needed, pain <4.   No problems per the pt. Maw

## 2012-03-03 ENCOUNTER — Telehealth: Payer: Self-pay | Admitting: *Deleted

## 2012-03-03 NOTE — Telephone Encounter (Signed)
Per patient reassignment, I have called and spoke with the patient. I have explained that Dr. Dalene Carrow is no longer with the practice, but has reviewed his chart before she left and recommends that Dr. Clelia Croft take over his care. Patient agrees to see Dr. Clelia Croft. Appts moved and old appts canceled. Patient given new appts.   JMW

## 2012-03-05 ENCOUNTER — Encounter: Payer: Self-pay | Admitting: Oncology

## 2012-04-06 ENCOUNTER — Encounter: Payer: Self-pay | Admitting: Family Medicine

## 2012-04-06 ENCOUNTER — Ambulatory Visit (INDEPENDENT_AMBULATORY_CARE_PROVIDER_SITE_OTHER): Payer: 59 | Admitting: Family Medicine

## 2012-04-06 VITALS — BP 126/74 | HR 96 | Temp 97.8°F | Wt 196.2 lb

## 2012-04-06 DIAGNOSIS — M7712 Lateral epicondylitis, left elbow: Secondary | ICD-10-CM

## 2012-04-06 DIAGNOSIS — M7710 Lateral epicondylitis, unspecified elbow: Secondary | ICD-10-CM

## 2012-04-06 MED ORDER — NAPROXEN 500 MG PO TABS: ORAL_TABLET | ORAL | Status: DC

## 2012-04-06 NOTE — Progress Notes (Signed)
  Subjective:    Patient ID: [name], male    DOB: 1950/09/24, 62 y.o.   MRN: [id number]  HPI CC: tender elbow  L elbow tenderness for last month.  Points to lateral epicondyle.  Has had tendonitis in past. Golfer, types at work.  Recently moved - did more lifting than he's used to.    Has tried topical OTC ointments which haven't helped.  Past Medical History  Diagnosis Date  . Nephrolithiasis   . Diverticulosis   . Cancer     rectal  . Hyperlipidemia      Review of Systems Per HPI    Objective:   Physical Exam  Nursing note and vitals reviewed. Constitutional: He appears well-developed and well-nourished. No distress.       L handed  Musculoskeletal: He exhibits no edema.       FROM at elbows. Pain with resisted wrist extension and pronation against resistance on left side Tender at left lateral epicondyle 2+ rad pulses Sensation intact       Assessment & Plan:

## 2012-04-06 NOTE — Assessment & Plan Note (Signed)
See pt instructions for plan. Advised if not improving as expected, to update [place] for voltaren script and PT referral.

## 2012-04-06 NOTE — Patient Instructions (Signed)
You have tennis elbow - see handout for exercises. Try to avoid repetitive movements of left hand, and lift things underhanded. Naprosyn anti inflammatory twice daily for 5 days then as needed. Let us know if not improving as expected.

## 2012-04-16 ENCOUNTER — Encounter (HOSPITAL_COMMUNITY): Payer: Self-pay

## 2012-04-16 ENCOUNTER — Other Ambulatory Visit (HOSPITAL_BASED_OUTPATIENT_CLINIC_OR_DEPARTMENT_OTHER): Payer: 59 | Admitting: Lab

## 2012-04-16 ENCOUNTER — Ambulatory Visit (HOSPITAL_COMMUNITY)
Admission: RE | Admit: 2012-04-16 | Discharge: 2012-04-16 | Disposition: A | Discharge status: Home / Self Care | Payer: 59 | Source: Ambulatory Visit | Attending: Hematology and Oncology | Admitting: Hematology and Oncology

## 2012-04-16 DIAGNOSIS — I2510 Atherosclerotic heart disease of native coronary artery without angina pectoris: Secondary | ICD-10-CM | POA: Insufficient documentation

## 2012-04-16 DIAGNOSIS — C20 Malignant neoplasm of rectum: Secondary | ICD-10-CM

## 2012-04-16 DIAGNOSIS — K5730 Diverticulosis of large intestine without perforation or abscess without bleeding: Secondary | ICD-10-CM | POA: Insufficient documentation

## 2012-04-16 DIAGNOSIS — K4090 Unilateral inguinal hernia, without obstruction or gangrene, not specified as recurrent: Secondary | ICD-10-CM | POA: Insufficient documentation

## 2012-04-16 DIAGNOSIS — I700 Atherosclerosis of aorta: Secondary | ICD-10-CM | POA: Insufficient documentation

## 2012-04-16 DIAGNOSIS — K449 Diaphragmatic hernia without obstruction or gangrene: Secondary | ICD-10-CM | POA: Insufficient documentation

## 2012-04-16 DIAGNOSIS — N400 Enlarged prostate without lower urinary tract symptoms: Secondary | ICD-10-CM | POA: Insufficient documentation

## 2012-04-16 DIAGNOSIS — R918 Other nonspecific abnormal finding of lung field: Secondary | ICD-10-CM | POA: Insufficient documentation

## 2012-04-16 DIAGNOSIS — Z85048 Personal history of other malignant neoplasm of rectum, rectosigmoid junction, and anus: Secondary | ICD-10-CM

## 2012-04-16 DIAGNOSIS — Z9049 Acquired absence of other specified parts of digestive tract: Secondary | ICD-10-CM | POA: Insufficient documentation

## 2012-04-16 LAB — COMPREHENSIVE METABOLIC PANEL (CC13)
ALT: 28 U/L (ref 0–55)
Albumin: 3.9 g/dL (ref 3.5–5.0)
CO2: 29 mEq/L (ref 22–29)
Chloride: 103 mEq/L (ref 98–107)
Glucose: 110 mg/dl — ABNORMAL HIGH (ref 70–99)
Potassium: 4.7 mEq/L (ref 3.5–5.1)
Sodium: 140 mEq/L (ref 136–145)
Total Bilirubin: 0.51 mg/dL (ref 0.20–1.20)
Total Protein: 7.7 g/dL (ref 6.4–8.3)

## 2012-04-16 LAB — CBC WITH DIFFERENTIAL/PLATELET
BASO%: 1.1 % (ref 0.0–2.0)
EOS%: 1.4 % (ref 0.0–7.0)
HCT: 45.4 % (ref 38.4–49.9)
LYMPH%: 30.3 % (ref 14.0–49.0)
MCH: 30.9 pg (ref 27.2–33.4)
MCHC: 35.1 g/dL (ref 32.0–36.0)
MONO%: 6.7 % (ref 0.0–14.0)
NEUT%: 60.5 % (ref 39.0–75.0)
Platelets: 231 10*3/uL (ref 140–400)
RBC: 5.16 10*6/uL (ref 4.20–5.82)

## 2012-04-16 MED ORDER — IOHEXOL 300 MG/ML  SOLN
100.0000 mL | Freq: Once | INTRAMUSCULAR | Status: AC | PRN
  Administered 2012-04-16: 100 mL via INTRAVENOUS

## 2012-04-20 ENCOUNTER — Telehealth: Payer: Self-pay | Admitting: Oncology

## 2012-04-20 ENCOUNTER — Ambulatory Visit: Payer: 59 | Admitting: Hematology and Oncology

## 2012-04-20 ENCOUNTER — Ambulatory Visit (HOSPITAL_BASED_OUTPATIENT_CLINIC_OR_DEPARTMENT_OTHER): Payer: 59 | Admitting: Oncology

## 2012-04-20 VITALS — BP 148/85 | HR 80 | Temp 97.4°F | Resp 18 | Ht 72.0 in | Wt 198.6 lb

## 2012-04-20 DIAGNOSIS — C20 Malignant neoplasm of rectum: Secondary | ICD-10-CM

## 2012-04-20 NOTE — Telephone Encounter (Signed)
, °

## 2012-04-20 NOTE — Progress Notes (Signed)
Hematology and Oncology Follow Up Visit  [name] [id number] Oct 10, 1950 62 y.o. 04/20/2012 3:05 PM [name], MDTower, [name], MD   Principle Diagnosis: 62 year old man diagnosed with T2N0 colon cancer diagnosed in 2005.   Prior Therapy: status post low anterior resection for stage I (T2 N0 M0) moderately differentiated adenocarcinoma of the rectum on 11/13/2003. None of the 20 lymph nodes sampled had evidence of tumor.   Current therapy: Observation and follow up.   Interim History:  Mr. [name] presents today for a follow up. He is a very nice man with the above diagnosis. Since his last visit, he has been doing well and is not reporting any new symptoms. He reports no complaints of chest pain or GI problems. He has had no loss in weight. He has had no change in bowel function. He has had no rectal bleeding.He continues to work full time.   Medications: I have reviewed the patient's current medications. Current outpatient prescriptions:aspirin 81 MG tablet, Take 81 mg by mouth daily., Disp: , Rfl: ;  Multiple Vitamin (MULTIVITAMIN) tablet, Take 1 tablet by mouth daily., Disp: , Rfl: ;  naproxen (NAPROSYN) 500 MG tablet, Take one po bid x 1 week then prn pain, take with food, Disp: 60 tablet, Rfl: 0  Allergies:  Allergies  Allergen Reactions  . Atorvastatin     REACTION: elevated transamines  . Pravastatin Sodium     REACTION: mood problems/irritability/personality change    Past Medical History, Surgical history, Social history, and Family History were reviewed and updated.  Review of Systems: Constitutional:  Negative for fever, chills, night sweats, anorexia, weight loss, pain. Cardiovascular: no chest pain or dyspnea on exertion Respiratory: negative Neurological: negative Dermatological: negative ENT: negative Skin: Negative. Gastrointestinal: negative Genito-Urinary: negative Hematological and Lymphatic: negative Breast: negative Musculoskeletal:  negative Remaining ROS negative. Physical Exam: Blood pressure 148/85, pulse 80, temperature 97.4 F (36.3 C), temperature source Oral, resp. rate 18, height 6' (1.829 m), weight 198 lb 9.6 oz (90.084 kg). ECOG:  0 General appearance: alert Head: Normocephalic, without obvious abnormality, atraumatic Neck: no adenopathy, no carotid bruit, no JVD, supple, symmetrical, trachea midline and thyroid not enlarged, symmetric, no tenderness/mass/nodules Lymph nodes: Cervical, supraclavicular, and axillary nodes normal. Heart:regular rate and rhythm, S1, S2 normal, no murmur, click, rub or gallop Lung:chest clear, no wheezing, rales, normal symmetric air entry Abdomin: soft, non-tender, without masses or organomegaly EXT:no erythema, induration, or nodules   Lab Results: Lab Results  Component Value Date   WBC 6.1 04/16/2012   HGB 15.9 04/16/2012   HCT 45.4 04/16/2012   MCV 88.1 04/16/2012   PLT 231 04/16/2012     Chemistry      Component Value Date/Time   NA 140 04/16/2012 0953   NA 146* 04/15/2011 0808   NA 138 04/03/2009 0811   K 4.7 04/16/2012 0953   K 4.5 04/15/2011 0808   K 4.1 04/03/2009 0811   CL 103 04/16/2012 0953   CL 100 04/15/2011 0808   CL 101 04/03/2009 0811   CO2 29 04/16/2012 0953   CO2 30 04/15/2011 0808   CO2 31 04/03/2009 0811   BUN 12.6 04/16/2012 0953   BUN 14 04/15/2011 0808   BUN 10 04/03/2009 0811   CREATININE 1.0 04/16/2012 0953   CREATININE 1.0 04/15/2011 0808   CREATININE 1.11 04/03/2009 0811      Component Value Date/Time   CALCIUM 9.8 04/16/2012 0953   CALCIUM 9.0 04/15/2011 0808   CALCIUM 9.2 04/03/2009 0811  ALKPHOS 99 04/16/2012 0953   ALKPHOS 97* 04/15/2011 0808   ALKPHOS 84 04/03/2009 0811   AST 19 04/16/2012 0953   AST 23 07/22/2011 0835   AST 28 04/15/2011 0808   ALT 28 04/16/2012 0953   ALT 31 07/22/2011 0835   BILITOT 0.51 04/16/2012 0953   BILITOT 0.60 04/15/2011 0808   BILITOT 0.8 04/03/2009 0811     CT CHEST, ABDOMEN AND PELVIS WITH CONTRAST  Technique:  Multidetector CT imaging of the chest, abdomen and  pelvis was performed following the standard protocol during bolus  administration of intravenous contrast.  Contrast: OMNIPAQUE IOHEXOL 300 MG/ML SOLN  Comparison: 04/15/2011  CT CHEST  Findings: 6 x 5 mm ground-glass nodule in the posterior right  upper lobe (series 4/image 21). Multiple additional scattered  nodules in the right upper lobe (series 4/image 29), left upper  lobe (series 4/image 27), right lower lobe (series 4/image 36) and  left lower lobe (series 4/images 31, 33, 39, and 46). This  appearance is unchanged from 2012 and therefore benign.  No pleural effusion or pneumothorax.  Visualized thyroid is unremarkable.  Heart is normal in size. No pericardial effusion. Coronary  atherosclerosis.  No suspicious mediastinal, hilar, or axillary lymphadenopathy.  Visualized osseous structures are within normal limits.  IMPRESSION:  No evidence of metastatic disease in the chest.  Scattered pulmonary nodules measuring up to 6 mm, unchanged from  2012, benign.  CT ABDOMEN AND PELVIS  Findings: Tiny hiatal hernia.  Liver, spleen, pancreas, and adrenal glands are within normal  limits.  Gallbladder is unremarkable. No intrahepatic or extrahepatic  ductal dilatation.  Kidneys are within normal limits. No hydronephrosis.  No evidence of bowel obstruction. Normal appendix. Colonic  diverticulosis, without associated inflammatory changes. Prior  distal colonic resection with suture line in the pelvis (series  2/image 113).  Atherosclerotic calcifications of the abdominal aorta and branch  vessels.  No abdominopelvic ascites.  No suspicious abdominopelvic lymphadenopathy.  Prostatomegaly, measuring 5.9 cm, with enlargement of the central  gland which indents the base of the bladder.  Bladder is underdistended.  Small fat-containing right inguinal hernia.  Mild degenerative changes of the lumbar spine.  IMPRESSION:  Prior  distal colonic resection.  No evidence of recurrent or metastatic disease in the  abdomen/pelvis.  Prostatomegaly.   Impression and Plan:  62 year old man with T2 N0 M0 moderately differentiated adenocarcinoma of the rectum diagnosed in 11/13/2003. He is S/P low anterior resection without the need for any adjuvant therapy.  CT scan from 03/2012 was reviewed and showed no evidence of cancer relapse.  Labs and exam indicate the same as well.  He is up to date on his colonoscopies (last was done in 02/2012)  The plan for is a follow up in 12 months with labs and an exam. If all clear then, we can be discharged from our follow up.      [facility], MD 2/21/20143:05 PM

## 2012-04-23 ENCOUNTER — Encounter: Payer: Self-pay | Admitting: Family Medicine

## 2012-04-23 ENCOUNTER — Ambulatory Visit (INDEPENDENT_AMBULATORY_CARE_PROVIDER_SITE_OTHER): Payer: 59 | Admitting: Family Medicine

## 2012-04-23 VITALS — BP 148/82 | HR 84 | Temp 98.9°F | Ht 72.0 in

## 2012-04-23 DIAGNOSIS — M7710 Lateral epicondylitis, unspecified elbow: Secondary | ICD-10-CM

## 2012-04-23 DIAGNOSIS — M7712 Lateral epicondylitis, left elbow: Secondary | ICD-10-CM

## 2012-04-23 NOTE — Assessment & Plan Note (Signed)
Minimal imp with ice/ forearm band / exercises/ naproxen  Likely needs injection Ref done to sport med Dr Patsy Lager

## 2012-04-23 NOTE — Patient Instructions (Addendum)
For lateral epicondylitis - continue the forearm band and the cold compress  Use the other arm for lifting whenever possible Continue the naproxen Make appt with Dr Patsy Lager on the way out for epicondylitis

## 2012-04-23 NOTE — Progress Notes (Signed)
  Subjective:    Patient ID: [name], male    DOB: Jul 07, 1950, 62 y.o.   MRN: [id number]  HPI Here for tennis elbow (L lateral epicondylitis) Saw Dr Reece Agar on 2/7  Anti inflammatory for 10 days - naproxen - it helps take the edge off but not making much progress Not swollen  Has a forearm band also- that helps   L handed  Is using ice/ cold packs - trying to do that twice daily   Lifting with a bent arm and overhand work is the worse  (lifting against resistance)   No particular repeditive motion now - but did lift heavy things when he was moving a while back  Some hammering   Patient Active Problem List  Diagnosis  . ADENOCARCINOMA, RECTUM  . HYPERLIPIDEMIA  . BACK PAIN, LUMBAR  . NEPHROLITHIASIS, HX OF  . Hyperglycemia  . Prostate cancer screening  . Elevated PSA  . Routine general medical examination at a health care facility  . Left lateral epicondylitis   Past Medical History  Diagnosis Date  . Nephrolithiasis   . Diverticulosis   . Hyperlipidemia   . rectal ca dx'd 2005    rectal   Past Surgical History  Procedure Laterality Date  . Enlarged prostate  08/2003    on CT scan  . Low anterior bowel resection     History  Substance Use Topics  . Smoking status: Never Smoker   . Smokeless tobacco: Never Used  . Alcohol Use: 2.4 oz/week    4 Glasses of wine per week     Comment: occ   Family History  Problem Relation Age of Onset  . Cancer Father 45    colon  . Colon cancer Father   . Cancer Brother     colon  . Colon cancer Brother    Allergies  Allergen Reactions  . Atorvastatin     REACTION: elevated transamines  . Pravastatin Sodium     REACTION: mood problems/irritability/personality change   Current Outpatient Prescriptions on File Prior to Visit  Medication Sig Dispense Refill  . aspirin 81 MG tablet Take 81 mg by mouth daily.      . Multiple Vitamin (MULTIVITAMIN) tablet Take 1 tablet by mouth daily.      . naproxen (NAPROSYN) 500 MG  tablet Take one po bid x 1 week then prn pain, take with food  60 tablet  0   No current facility-administered medications on file prior to visit.      Review of Systems Neg for elbow swelling/redness/ noise  Neg for numbness or loss of strength    Objective:   Physical Exam  Constitutional: He appears well-developed and well-nourished. No distress.  HENT:  Head: Normocephalic and atraumatic.  Musculoskeletal: He exhibits tenderness. He exhibits no edema.       Left elbow: He exhibits decreased range of motion. He exhibits no swelling, no effusion and no deformity. Tenderness found. Lateral epicondyle tenderness noted. No olecranon process tenderness noted.  Pain with resisted flex of elbow and also with grasp and lifting  Neurological: He is alert. He has normal strength and normal reflexes. He displays no atrophy. No sensory deficit. He exhibits normal muscle tone.  Skin: Skin is warm and dry. No erythema.  Psychiatric: He has a normal mood and affect.          Assessment & Plan:

## 2012-04-25 ENCOUNTER — Encounter: Payer: Self-pay | Admitting: Family Medicine

## 2012-04-25 ENCOUNTER — Ambulatory Visit (INDEPENDENT_AMBULATORY_CARE_PROVIDER_SITE_OTHER): Payer: 59 | Admitting: Family Medicine

## 2012-04-25 VITALS — BP 130/74 | HR 96 | Temp 98.1°F | Ht 72.0 in | Wt 198.4 lb

## 2012-04-25 DIAGNOSIS — M7710 Lateral epicondylitis, unspecified elbow: Secondary | ICD-10-CM

## 2012-04-25 DIAGNOSIS — M7712 Lateral epicondylitis, left elbow: Secondary | ICD-10-CM

## 2012-04-25 NOTE — Progress Notes (Signed)
Nature conservation officer at [facility] [address] [place] [address] Phone: [phone] Fax: [phone]  Date:  04/25/2012   Name:  [name]   DOB:  15-Oct-1950   MRN:  [id number] Gender: male Age: 62 y.o.  PCP:  [name], MD  Evaluating MD: Hannah Beat, MD  Patient presents with lateral elbow pain.  Length of symptoms: > 4 weeks Hand effected: L  Patient describes a dull ache on the lateral elbow. There is some translation in the proximal forearm and in the distal upper arm. It is painful to lift with the hand facing down and to lift with the thumb in an upright position. Supination is painful. Patient points to the lateral epicondyle as the point of maximal tenderness near ECRB. 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 Plays a lot of golf.  Dr. Reece Agar gave some NSAIDS.   No trauma.   No prior fractures or operative interventions in the effective hand. Prior PT or HEP: Dr. Reece Agar gave Rouzier's HEP  Denies numbness or tingling. No significant neck or shoulder pain.  Hand of dominance: R  Patient Active Problem List  Diagnosis  . ADENOCARCINOMA, RECTUM  . HYPERLIPIDEMIA  . BACK PAIN, LUMBAR  . NEPHROLITHIASIS, HX OF  . Hyperglycemia  . Prostate cancer screening  . Elevated PSA  . Routine general medical examination at a health care facility  . Left lateral epicondylitis    Past Medical History  Diagnosis Date  . Nephrolithiasis   . Diverticulosis   . Hyperlipidemia   . rectal ca dx'd 2005    rectal    Past Surgical History  Procedure Laterality Date  . Enlarged prostate  08/2003    on CT scan  . Low anterior bowel resection      History   Social History  . Marital Status: Married    Spouse Name: N/A    Number of Children: N/A  . Years of Education: N/A   Occupational History  . Deputy [employer]   Social History Main Topics  . Smoking status: Never Smoker   . Smokeless tobacco: Never Used  .  Alcohol Use: 2.4 oz/week    4 Glasses of wine per week     Comment: occ  . Drug Use: No  . Sexually Active: Not on file   Other Topics Concern  . Not on file   Social History Narrative  . No narrative on file    Family History  Problem Relation Age of Onset  . Cancer Father 3    colon  . Colon cancer Father   . Cancer Brother     colon  . Colon cancer Brother     Allergies  Allergen Reactions  . Atorvastatin     REACTION: elevated transamines  . Pravastatin Sodium     REACTION: mood problems/irritability/personality change    Current Outpatient Prescriptions on File Prior to Visit  Medication Sig Dispense Refill  . aspirin 81 MG tablet Take 81 mg by mouth daily.      . Multiple Vitamin (MULTIVITAMIN) tablet Take 1 tablet by mouth daily.      . naproxen (NAPROSYN) 500 MG tablet Take one po bid x 1 week then prn pain, take with food  60 tablet  0   No current facility-administered medications on file prior to visit.     REVIEW OF SYSTEMS  GEN: No fevers, chills. Nontoxic. Primarily MSK c/o today.  MSK: Detailed in the HPI GI: tolerating PO intake without difficulty Neuro: No numbness, parasthesias, or tingling associated. Otherwise the pertinent positives of the ROS are noted above.   PHYSICAL EXAM  Blood pressure 130/74, pulse 96, temperature 98.1 F (36.7 C), temperature source Oral, height 6' (1.829 m), weight 198 lb 6 oz (89.982 kg), SpO2 96.00%.  GEN: Well-developed,well-nourished,in no acute distress; alert,appropriate and cooperative throughout examination HEENT: Normocephalic and atraumatic without obvious abnormalities. Ears, externally no deformities PULM: Breathing comfortably in no respiratory distress EXT: No clubbing, cyanosis, or edema PSYCH: Normally interactive. Cooperative during the interview. Pleasant. Friendly and conversant. Not anxious or depressed appearing. Normal, full affect.  L elbow Ecchymosis or edema: neg ROM: full flexion,  extension, pronation, supination Shoulder ROM: Full Flexion: 5/5 Extension: 5/5, PAINFUL Supination: 5/5, PAINFUL Pronation: 5/5 Wrist ext: 5/5 Wrist flexion: 5/5 No gross bony abnormality Varus and Valgus stress: stable ECRB tenderness: YES, TTP Medial epicondyle: NT Lateral epicondyle, resisted wrist extension from wrist full pronation and flexion: PAINFUL grip: 5/5  sensation intact Tinel's, Elbow: negative  A/P: Lateral Epicondylitis: Elbow anatomy was reviewed, and tendinopathy was explained.  Pt. given a formal rehab program from [facility].  Series of concentric and eccentric exercises should be done starting with no weight, work up to 1 lb, hammer, etc.  Use counterforce strap if working or using hands.  Formal PT would be beneficial if fails conservative management Emphasized stretching an cross-friction massage Emphasized proper palms up lifting biomechanics to unload ECRB   Lateral Epicondylitis Injection, L Verbal consent was obtained from the patient. Risks, benefits, and alternatives were discussed. Potential complications including loss of pigment, atrophy, and rare risk of infection were discussed. Prepped with Chloraprep and Ethyl Chloride used for anesthesia. Under sterile conditions, the patient was injected at the point of maximal tenderness at the ECRB tendon with 2 cc of Lidocaine 1% and 1 cc of Depo-Medrol 40 mg. Decreased pain after injection. No complications.  Needle size: 22 gauge 1 1/2 inch

## 2012-09-06 ENCOUNTER — Telehealth: Payer: Self-pay

## 2012-09-06 NOTE — Telephone Encounter (Signed)
Pt had cortisone injection in lt elbow 03/2012 and pt had relief from pain; pt thinks has aggravated lt elbow and has pain intermittently. Pt request appt with Dr Patsy Lager for another cortisone injection; CMA said 15 min appt OK. Pt scheduled 09/12/12 at 11 am.

## 2012-09-12 ENCOUNTER — Ambulatory Visit (INDEPENDENT_AMBULATORY_CARE_PROVIDER_SITE_OTHER): Payer: 59 | Admitting: Family Medicine

## 2012-09-12 ENCOUNTER — Encounter: Payer: Self-pay | Admitting: Family Medicine

## 2012-09-12 VITALS — BP 140/90 | HR 60 | Temp 97.8°F | Ht 72.0 in | Wt 187.0 lb

## 2012-09-12 DIAGNOSIS — M7712 Lateral epicondylitis, left elbow: Secondary | ICD-10-CM

## 2012-09-12 DIAGNOSIS — M7710 Lateral epicondylitis, unspecified elbow: Secondary | ICD-10-CM

## 2012-09-12 NOTE — Progress Notes (Signed)
   [facility] [address] [place] [address] Phone: [phone] Fax: [phone]  Date:  09/12/2012   Name:  [name]   DOB:  1950-08-13   MRN:  [id number] Gender: male Age: 62 y.o.  PCP:  [name], MD  Evaluating MD: Hannah Beat, MD  Patient presents with lateral elbow pain.  Length of symptoms: > 6 mo Hand effected: L  Patient describes a dull ache on the lateral elbow. There is some translation in the proximal forearm and in the distal upper arm. It is painful to lift with the hand facing down and to lift with the thumb in an upright position. Supination is painful. Patient points to the lateral epicondyle as the point of maximal tenderness near ECRB.  No trauma.   No prior fractures or operative interventions in the effective hand. Prior PT or HEP: trial of HEP with minimal compliance  Denies numbness or tingling. No significant neck or shoulder pain.  REVIEW OF SYSTEMS  GEN: No fevers, chills. Nontoxic. Primarily MSK c/o today. MSK: Detailed in the HPI GI: tolerating PO intake without difficulty Neuro: No numbness, parasthesias, or tingling associated. Otherwise the pertinent positives of the ROS are noted above.   PHYSICAL EXAM  Blood pressure 140/90, pulse 60, temperature 97.8 F (36.6 C), temperature source Oral, height 6' (1.829 m), weight 187 lb (84.823 kg), SpO2 98.00%.  GEN: Well-developed,well-nourished,in no acute distress; alert,appropriate and cooperative throughout examination HEENT: Normocephalic and atraumatic without obvious abnormalities. Ears, externally no deformities PULM: Breathing comfortably in no respiratory distress EXT: No clubbing, cyanosis, or edema PSYCH: Normally interactive. Cooperative during the interview. Pleasant. Friendly and conversant. Not anxious or depressed appearing. Normal, full affect.  LEFT elbow Ecchymosis or edema: neg ROM: full flexion, extension, pronation,  supination Shoulder ROM: Full Flexion: 5/5 Extension: 5/5, PAINFUL Supination: 5/5, PAINFUL Pronation: 5/5 Wrist ext: 5/5 Wrist flexion: 5/5 No gross bony abnormality Varus and Valgus stress: stable ECRB tenderness: YES, TTP Medial epicondyle: NT Lateral epicondyle, resisted wrist extension from wrist full pronation and flexion: PAINFUL grip: 5/5  sensation intact Tinel's, Elbow: negative  A/P: Lateral Epicondylitis: Elbow anatomy was reviewed, and tendinopathy was explained.  Failure to improve, > 6 mo. Discussed potential surgical options and to keep this in mind if no resolution with injection and PT.  Use counterforce strap if working or using hands.  Formal PT would be beneficial - consult made.  Emphasized stretching an cross-friction massage Emphasized proper palms up lifting biomechanics to unload ECRB   Lateral Epicondylitis Injection, LEFT Verbal consent was obtained from the patient. Risks, benefits, and alternatives were discussed. Potential complications including loss of pigment, atrophy, and rare risk of infection were discussed. Prepped with Chloraprep and Ethyl Chloride used for anesthesia. Under sterile conditions, the patient was injected at the point of maximal tenderness at the ECRB tendon with 2 cc of Lidocaine 1% and 1 cc of Depo-Medrol 40 mg. Decreased pain after injection. No complications.  Needle size: 22 gauge 1 1/2 inch

## 2013-01-03 ENCOUNTER — Other Ambulatory Visit: Payer: Self-pay

## 2013-04-19 ENCOUNTER — Ambulatory Visit (HOSPITAL_BASED_OUTPATIENT_CLINIC_OR_DEPARTMENT_OTHER): Payer: 59 | Admitting: Oncology

## 2013-04-19 ENCOUNTER — Encounter: Payer: Self-pay | Admitting: Oncology

## 2013-04-19 ENCOUNTER — Other Ambulatory Visit (HOSPITAL_BASED_OUTPATIENT_CLINIC_OR_DEPARTMENT_OTHER): Payer: 59

## 2013-04-19 VITALS — BP 172/83 | HR 92 | Temp 97.0°F | Resp 19 | Ht 72.0 in | Wt 200.0 lb

## 2013-04-19 DIAGNOSIS — C20 Malignant neoplasm of rectum: Secondary | ICD-10-CM

## 2013-04-19 LAB — CBC WITH DIFFERENTIAL/PLATELET
BASO%: 1.2 % (ref 0.0–2.0)
BASOS ABS: 0.1 10*3/uL (ref 0.0–0.1)
EOS ABS: 0.1 10*3/uL (ref 0.0–0.5)
EOS%: 1.5 % (ref 0.0–7.0)
HEMATOCRIT: 45.5 % (ref 38.4–49.9)
HEMOGLOBIN: 15.5 g/dL (ref 13.0–17.1)
LYMPH#: 2.1 10*3/uL (ref 0.9–3.3)
LYMPH%: 28.5 % (ref 14.0–49.0)
MCH: 31 pg (ref 27.2–33.4)
MCHC: 34.1 g/dL (ref 32.0–36.0)
MCV: 90.8 fL (ref 79.3–98.0)
MONO#: 0.5 10*3/uL (ref 0.1–0.9)
MONO%: 6.7 % (ref 0.0–14.0)
NEUT#: 4.7 10*3/uL (ref 1.5–6.5)
NEUT%: 62.1 % (ref 39.0–75.0)
Platelets: 252 10*3/uL (ref 140–400)
RBC: 5.01 10*6/uL (ref 4.20–5.82)
RDW: 12.6 % (ref 11.0–14.6)
WBC: 7.5 10*3/uL (ref 4.0–10.3)

## 2013-04-19 LAB — COMPREHENSIVE METABOLIC PANEL (CC13)
ALT: 39 U/L (ref 0–55)
ANION GAP: 10 meq/L (ref 3–11)
AST: 26 U/L (ref 5–34)
Albumin: 4.1 g/dL (ref 3.5–5.0)
Alkaline Phosphatase: 102 U/L (ref 40–150)
BUN: 14 mg/dL (ref 7.0–26.0)
CALCIUM: 9.6 mg/dL (ref 8.4–10.4)
CHLORIDE: 105 meq/L (ref 98–109)
CO2: 27 meq/L (ref 22–29)
CREATININE: 1.1 mg/dL (ref 0.7–1.3)
Glucose: 104 mg/dl (ref 70–140)
Potassium: 4.4 mEq/L (ref 3.5–5.1)
Sodium: 142 mEq/L (ref 136–145)
Total Bilirubin: 0.31 mg/dL (ref 0.20–1.20)
Total Protein: 7.8 g/dL (ref 6.4–8.3)

## 2013-04-19 NOTE — Progress Notes (Signed)
Hematology and Oncology Follow Up Visit  [name] [id number] 1950-04-06 63 y.o. 04/19/2013 1:33 PM Loura Pardon, MDTower, Wynelle Fanny, MD   Principle Diagnosis: 63 year old man diagnosed with T2N0 colon cancer diagnosed in 2005.   Prior Therapy: status post low anterior resection for stage I (T2 N0 M0) moderately differentiated adenocarcinoma of the rectum on 11/13/2003. None of the 20 lymph nodes sampled had evidence of tumor.   Current therapy: Observation and follow up.   Interim History:  [name] presents today for a follow up. He is a very nice man with the above diagnosis. Since his last visit, he has been doing well and is not reporting any new symptoms. He reports no complaints of chest pain or GI problems. He has had no loss in weight. He has had no change in bowel function. He has had no rectal bleedingHe continues to work full time. He has not reported any illnesses or hospitalizations. His review of systems was otherwise unremarkable including no constitutional symptoms of fevers or chills or sweats. He did not report any neurological symptoms of alteration of mental status her psychiatric issues of depression. Is not reporting any pulmonary symptoms of shortness of breath or difficulty breathing no cough or hemoptysis or hematemesis.  Medications: I have reviewed the patient's current medications.  Current Outpatient Prescriptions  Medication Sig Dispense Refill  . aspirin 81 MG tablet Take 81 mg by mouth daily.      Marland Kitchen ibuprofen (ADVIL,MOTRIN) 200 MG tablet Take 200 mg by mouth every 6 (six) hours as needed for pain.      . Multiple Vitamin (MULTIVITAMIN) tablet Take 1 tablet by mouth daily.       No current facility-administered medications for this visit.    Allergies:  Allergies  Allergen Reactions  . Atorvastatin     REACTION: elevated transamines  . Pravastatin Sodium     REACTION: mood problems/irritability/personality change    Past Medical History, Surgical  history, Social history, and Family History were reviewed and updated.  Review of Systems: Constitutional:  Negative for fever, chills, night sweats, anorexia, weight loss, pain.  Remaining ROS negative. Physical Exam: Blood pressure 172/83, pulse 92, temperature 97 F (36.1 C), temperature source Oral, resp. rate 19, height 6' (1.829 m), weight 200 lb (90.719 kg). ECOG:  0 General appearance: alert Head: Normocephalic, without obvious abnormality, atraumatic Neck: no adenopathy, no carotid bruit, no JVD, supple, symmetrical, trachea midline and thyroid not enlarged, symmetric, no tenderness/mass/nodules Lymph nodes: Cervical, supraclavicular, and axillary nodes normal. Heart:regular rate and rhythm, S1, S2 normal, no murmur, click, rub or gallop Lung:chest clear, no wheezing, rales, normal symmetric air entry Abdomin: soft, non-tender, without masses or organomegaly EXT:no erythema, induration, or nodules   Lab Results: Lab Results  Component Value Date   WBC 7.5 04/19/2013   HGB 15.5 04/19/2013   HCT 45.5 04/19/2013   MCV 90.8 04/19/2013   PLT 252 04/19/2013     Chemistry      Component Value Date/Time   NA 140 04/16/2012 0953   NA 146* 04/15/2011 0808   NA 138 04/03/2009 0811   K 4.7 04/16/2012 0953   K 4.5 04/15/2011 0808   K 4.1 04/03/2009 0811   CL 103 04/16/2012 0953   CL 100 04/15/2011 0808   CL 101 04/03/2009 0811   CO2 29 04/16/2012 0953   CO2 30 04/15/2011 0808   CO2 31 04/03/2009 0811   BUN 12.6 04/16/2012 0953   BUN 14 04/15/2011 4098  BUN 10 04/03/2009 0811   CREATININE 1.0 04/16/2012 0953   CREATININE 1.0 04/15/2011 0808   CREATININE 1.11 04/03/2009 0811      Component Value Date/Time   CALCIUM 9.8 04/16/2012 0953   CALCIUM 9.0 04/15/2011 0808   CALCIUM 9.2 04/03/2009 0811   ALKPHOS 99 04/16/2012 0953   ALKPHOS 97* 04/15/2011 0808   ALKPHOS 84 04/03/2009 0811   AST 19 04/16/2012 0953   AST 23 07/22/2011 0835   AST 28 04/15/2011 0808   ALT 28 04/16/2012 0953   ALT 31 07/22/2011  0835   ALT 38 04/15/2011 0808   BILITOT 0.51 04/16/2012 0953   BILITOT 0.60 04/15/2011 0808   BILITOT 0.8 04/03/2009 0811     Impression and Plan:  63 year old man with T2 N0 M0 moderately differentiated adenocarcinoma of the rectum diagnosed in 11/13/2003. He is S/P low anterior resection without the need for any adjuvant therapy.  CT scan from 03/2012 showed no evidence of cancer relapse.  Labs and exam indicate the same as well.  He is up to date on his colonoscopies (last was done in 02/2012)  At this point, he does not need any routine oncology followup I will be happy to see him in the future as needed.     Zola Button, MD 2/20/20151:33 PM

## 2013-04-20 LAB — CEA: CEA: <0.5 ng/mL (ref 0.0–5.0)

## 2014-10-16 IMAGING — CT CT CHEST W/ CM
2 of 5 series · 16 of 46 positions shown, 18 images · IV contrast (OMNIPAQUE)
Comparison: 04/15/2011

CT CHEST

CLINICAL DATA: Rectal cancer with surgical resection

CT CHEST, ABDOMEN AND PELVIS WITH CONTRAST
TECHNIQUE: Multidetector CT imaging of the chest, abdomen and
pelvis was performed following the standard protocol during bolus
administration of intravenous contrast.
Contrast: 100mL OMNIPAQUE IOHEXOL 300 MG/ML  SOLN

[Series 2: cap with st · axial · 0.73mm/px · z∈[+630,+1240]mm · 13 of 138 slices shown, 15 images]
[im 8/138  soft-tissue]
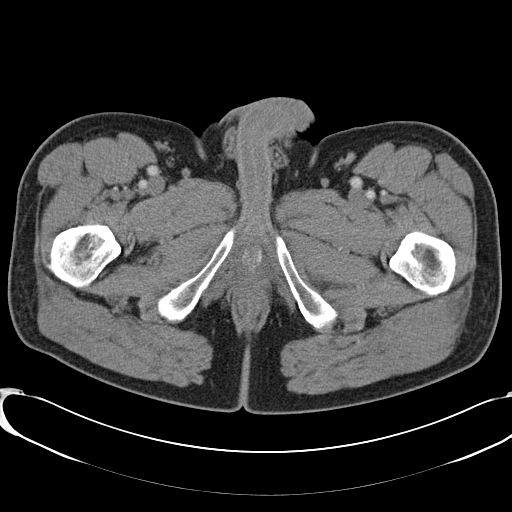
[im 8/138  bone]
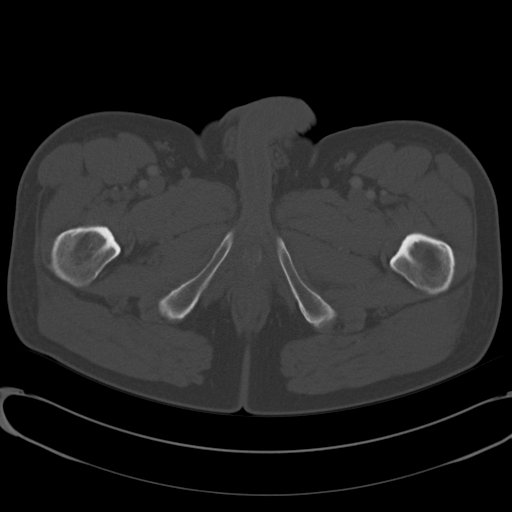
[im 16/138  soft-tissue]
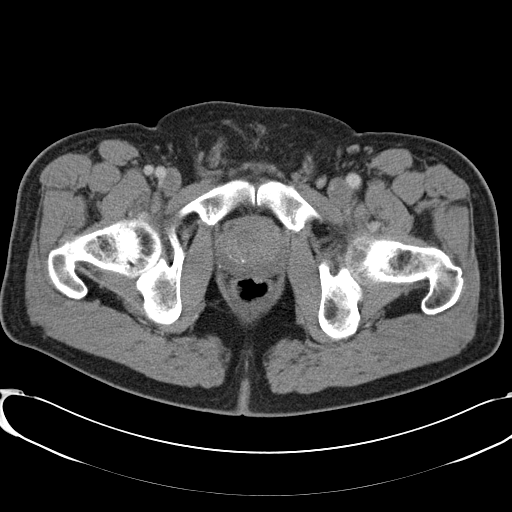
[im 31/138  soft-tissue]
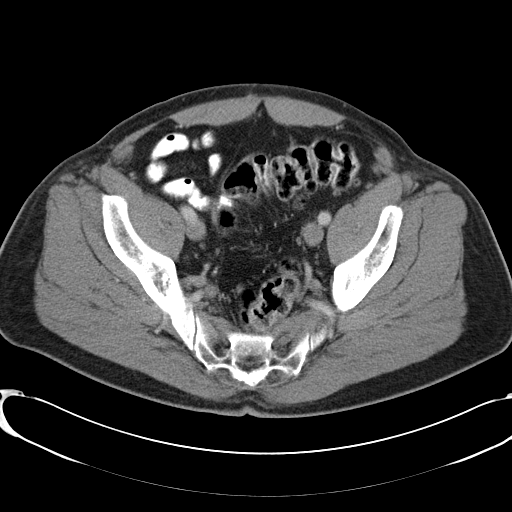
[im 39/138  soft-tissue]
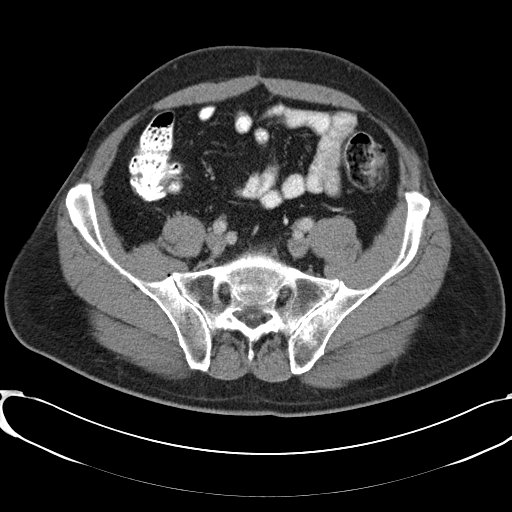
[im 46/138  soft-tissue]
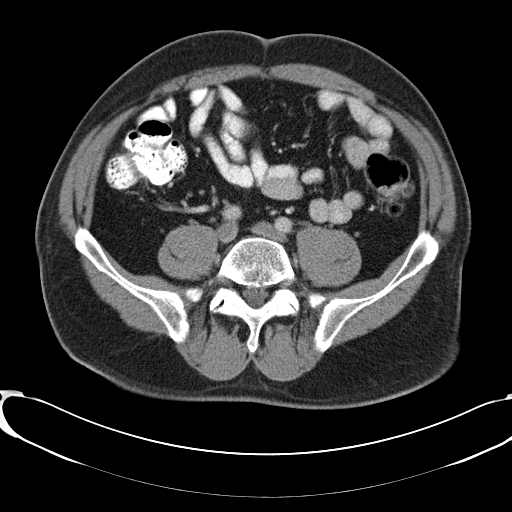
[im 61/138  soft-tissue]
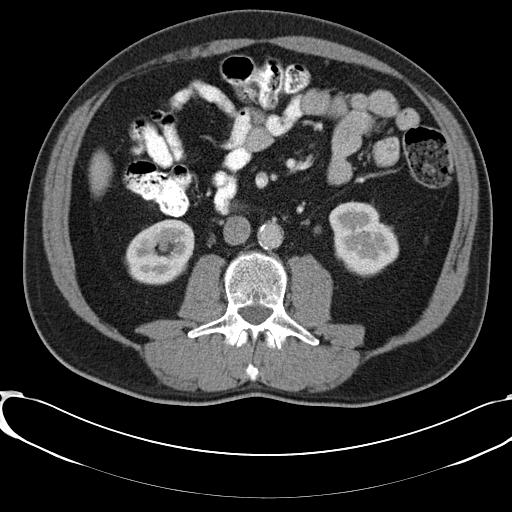
[im 69/138  soft-tissue]
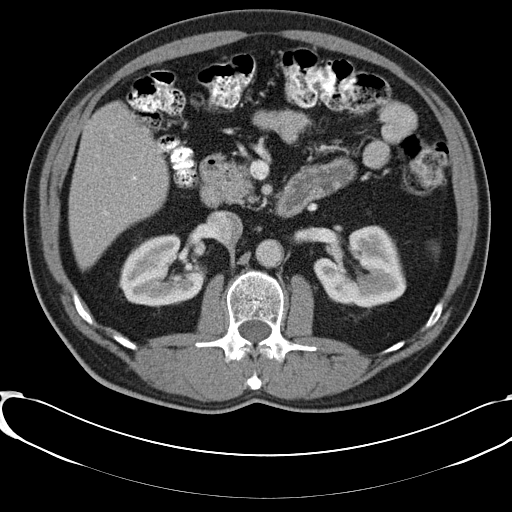
[im 77/138  soft-tissue]
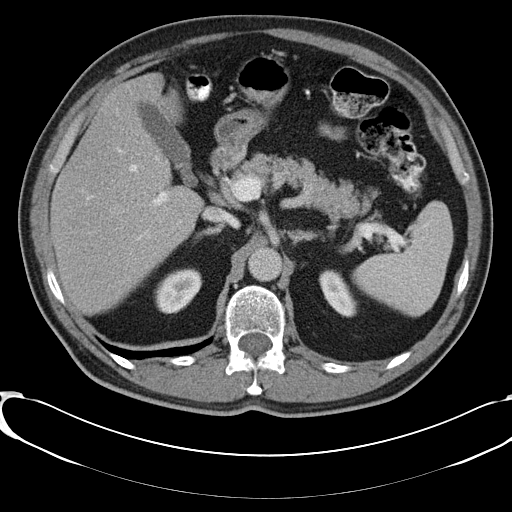
[im 92/138  soft-tissue]
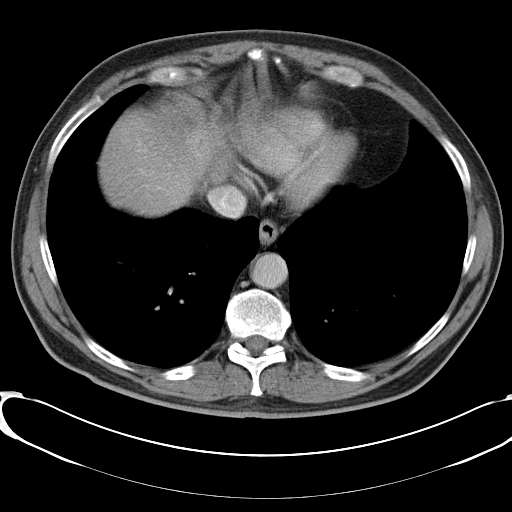
[im 92/138  bone]
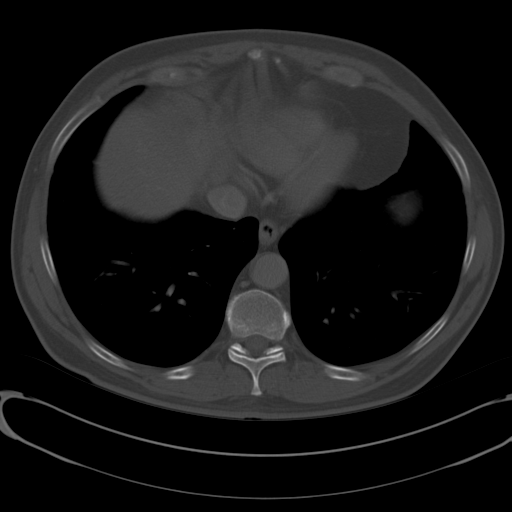
[im 99/138  soft-tissue]
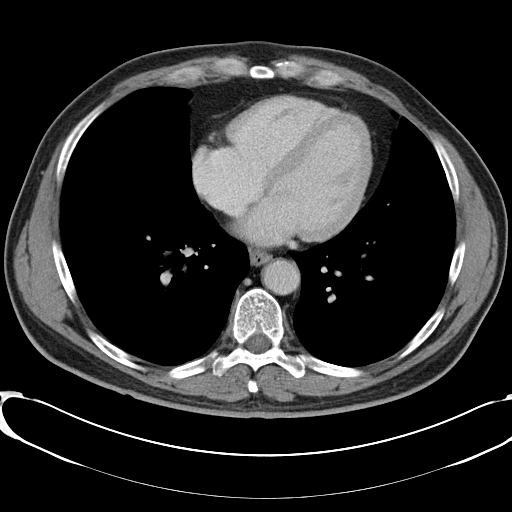
[im 107/138  soft-tissue]
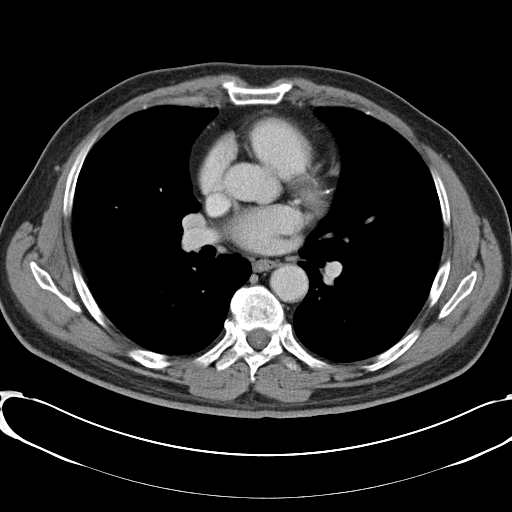
[im 122/138  soft-tissue]
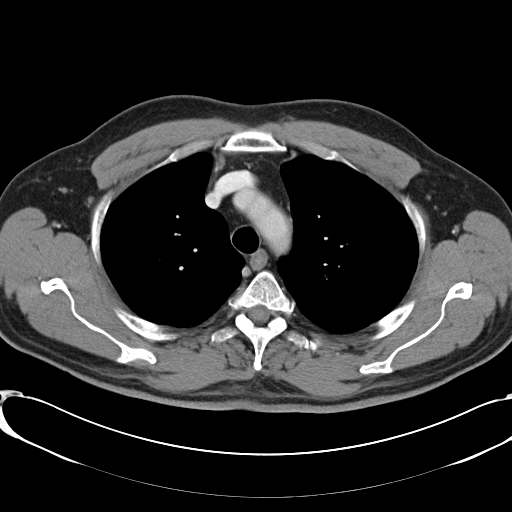
[im 130/138  soft-tissue]
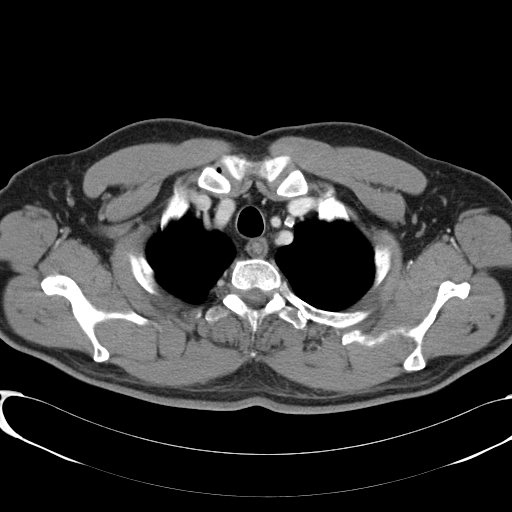

[Series 602: <mpr thick range> · coronal · 1.35mm/px · 3 of 98 slices shown]
[im 33/98  soft-tissue]
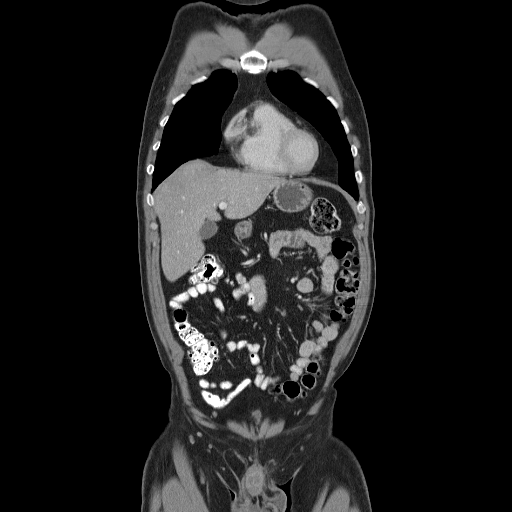
[im 44/98  soft-tissue]
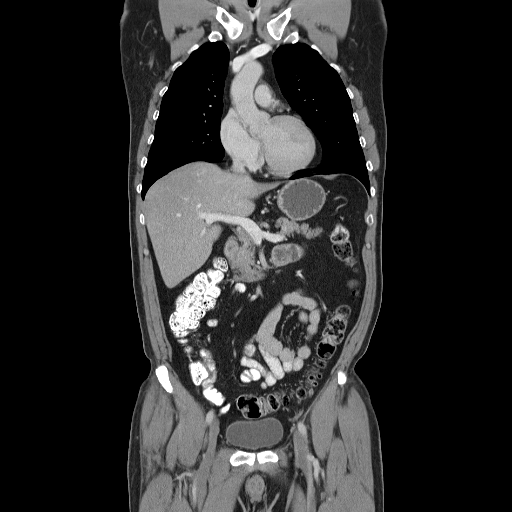
[im 54/98  soft-tissue]
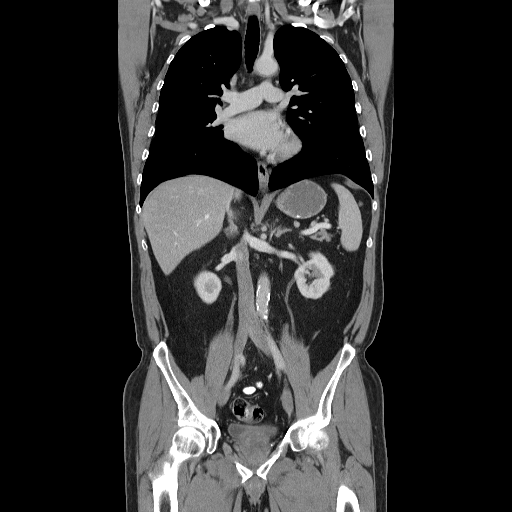

[16 of 46 positions shown; findings below may reference images not displayed]

FINDINGS: 6 x 5 mm ground-glass nodule in the posterior right
upper lobe (series 4/image 21).  Multiple additional scattered
nodules in the right upper lobe (series 4/image 29), left upper
lobe (series 4/image 27), right lower lobe (series 4/image 36) and
left lower lobe (series 4/images 31, 33, 39, and 46).  This
appearance is unchanged from 0260 and therefore benign.

No pleural effusion or pneumothorax.

Visualized thyroid is unremarkable.

Heart is normal in size.  No pericardial effusion.  Coronary
atherosclerosis.

No suspicious mediastinal, hilar, or axillary lymphadenopathy.

Visualized osseous structures are within normal limits.
IMPRESSION: No evidence of metastatic disease in the chest.

Scattered pulmonary nodules measuring up to 6 mm, unchanged from
0260, benign.

CT ABDOMEN AND PELVIS
FINDINGS: Tiny hiatal hernia.

Liver, spleen, pancreas, and adrenal glands are within normal
limits.

Gallbladder is unremarkable.  No intrahepatic or extrahepatic
ductal dilatation.

Kidneys are within normal limits.  No hydronephrosis.

No evidence of bowel obstruction.  Normal appendix.  Colonic
diverticulosis, without associated inflammatory changes.  Prior
distal colonic resection with suture line in the pelvis (series
2/image 113).

Atherosclerotic calcifications of the abdominal aorta and branch
vessels.

No abdominopelvic ascites.

No suspicious abdominopelvic lymphadenopathy.

Prostatomegaly, measuring 5.9 cm, with enlargement of the central
gland which indents the base of the bladder.

Bladder is underdistended.

Small fat-containing right inguinal hernia.

Mild degenerative changes of the lumbar spine.
IMPRESSION: Prior distal colonic resection.

No evidence of recurrent or metastatic disease in the
abdomen/pelvis.

Prostatomegaly.

## 2016-05-06 ENCOUNTER — Telehealth: Payer: Self-pay | Admitting: Family Medicine

## 2016-05-06 NOTE — Telephone Encounter (Signed)
Left pt message asking to call Allison back directly at [phone] to schedule AWV/CPE with PCP. °

## 2016-05-09 NOTE — Telephone Encounter (Signed)
Spoke to pt. Pt last saw Dr. Glori Bickers in 03/2012. He was on my list to call for AWV. He has hit the 3 year mark and did not want to be switched to [place] or [place]. I explained to him that he would be a new pt and you are no longer accepting new pt's.   Pt would like to know if you will still have him as a patient? If so, where would you like me to schedule him OV?

## 2016-05-09 NOTE — Telephone Encounter (Signed)
I am fine with keeping him  Go ahead and schedule  Thanks

## 2016-05-11 NOTE — Telephone Encounter (Signed)
Spoke to pt wife. She will have pt call back to schedule appt.

## 2016-05-29 HISTORY — PX: GANGLION CYST EXCISION: SHX1691

## 2016-06-06 ENCOUNTER — Encounter: Payer: Self-pay | Admitting: Family Medicine

## 2016-06-06 ENCOUNTER — Ambulatory Visit (INDEPENDENT_AMBULATORY_CARE_PROVIDER_SITE_OTHER): Payer: Medicare Other | Admitting: Family Medicine

## 2016-06-06 VITALS — BP 160/84 | HR 85 | Temp 97.6°F | Ht 71.75 in | Wt 194.5 lb

## 2016-06-06 DIAGNOSIS — R972 Elevated prostate specific antigen [PSA]: Secondary | ICD-10-CM

## 2016-06-06 DIAGNOSIS — C20 Malignant neoplasm of rectum: Secondary | ICD-10-CM

## 2016-06-06 DIAGNOSIS — E7800 Pure hypercholesterolemia, unspecified: Secondary | ICD-10-CM

## 2016-06-06 DIAGNOSIS — R739 Hyperglycemia, unspecified: Secondary | ICD-10-CM | POA: Diagnosis not present

## 2016-06-06 DIAGNOSIS — R030 Elevated blood-pressure reading, without diagnosis of hypertension: Secondary | ICD-10-CM

## 2016-06-06 DIAGNOSIS — R229 Localized swelling, mass and lump, unspecified: Secondary | ICD-10-CM

## 2016-06-06 DIAGNOSIS — Z23 Encounter for immunization: Secondary | ICD-10-CM | POA: Diagnosis not present

## 2016-06-06 DIAGNOSIS — I10 Essential (primary) hypertension: Secondary | ICD-10-CM | POA: Insufficient documentation

## 2016-06-06 DIAGNOSIS — IMO0002 Reserved for concepts with insufficient information to code with codable children: Secondary | ICD-10-CM

## 2016-06-06 DIAGNOSIS — Z0000 Encounter for general adult medical examination without abnormal findings: Secondary | ICD-10-CM

## 2016-06-06 DIAGNOSIS — Z125 Encounter for screening for malignant neoplasm of prostate: Secondary | ICD-10-CM

## 2016-06-06 LAB — COMPREHENSIVE METABOLIC PANEL
ALBUMIN: 4.4 g/dL (ref 3.5–5.2)
ALK PHOS: 99 U/L (ref 39–117)
ALT: 30 U/L (ref 0–53)
AST: 20 U/L (ref 0–37)
BILIRUBIN TOTAL: 0.3 mg/dL (ref 0.2–1.2)
BUN: 16 mg/dL (ref 6–23)
CALCIUM: 10.3 mg/dL (ref 8.4–10.5)
CO2: 32 mEq/L (ref 19–32)
CREATININE: 1.01 mg/dL (ref 0.40–1.50)
Chloride: 103 mEq/L (ref 96–112)
GFR: 78.39 mL/min (ref >60.00)
Glucose, Bld: 87 mg/dL (ref 70–99)
Potassium: 4.9 mEq/L (ref 3.5–5.1)
Sodium: 142 mEq/L (ref 135–145)
TOTAL PROTEIN: 7.6 g/dL (ref 6.0–8.3)

## 2016-06-06 LAB — TSH: TSH: 1.9 u[IU]/mL (ref 0.35–4.50)

## 2016-06-06 LAB — CBC WITH DIFFERENTIAL/PLATELET
BASOS PCT: 0.9 % (ref 0.0–3.0)
Basophils Absolute: 0.1 10*3/uL (ref 0.0–0.1)
EOS ABS: 0.1 10*3/uL (ref 0.0–0.7)
Eosinophils Relative: 1.2 % (ref 0.0–5.0)
HCT: 45.5 % (ref 39.0–52.0)
Hemoglobin: 15.6 g/dL (ref 13.0–17.0)
LYMPHS ABS: 2.1 10*3/uL (ref 0.7–4.0)
LYMPHS PCT: 28.9 % (ref 12.0–46.0)
MCHC: 34.3 g/dL (ref 30.0–36.0)
MCV: 89.6 fl (ref 78.0–100.0)
Monocytes Absolute: 0.5 10*3/uL (ref 0.1–1.0)
Monocytes Relative: 6.7 % (ref 3.0–12.0)
NEUTROS ABS: 4.6 10*3/uL (ref 1.4–7.7)
Neutrophils Relative %: 62.3 % (ref 43.0–77.0)
Platelets: 260 10*3/uL (ref 150.0–400.0)
RBC: 5.08 Mil/uL (ref 4.22–5.81)
RDW: 13 % (ref 11.5–15.5)
WBC: 7.4 10*3/uL (ref 4.0–10.5)

## 2016-06-06 LAB — LIPID PANEL
CHOLESTEROL: 324 mg/dL — AB (ref 0–200)
HDL: 51.7 mg/dL (ref >39.00)
NonHDL: 272.76
TRIGLYCERIDES: 337 mg/dL — AB (ref 0.0–149.0)
Total CHOL/HDL Ratio: 6
VLDL: 67.4 mg/dL — ABNORMAL HIGH (ref 0.0–40.0)

## 2016-06-06 LAB — LDL CHOLESTEROL, DIRECT: LDL DIRECT: 214 mg/dL

## 2016-06-06 LAB — PSA, MEDICARE: PSA: 5.87 ng/ml — ABNORMAL HIGH (ref 0.10–4.00)

## 2016-06-06 LAB — HEMOGLOBIN A1C: Hgb A1c MFr Bld: 6.2 % (ref 4.6–6.5)

## 2016-06-06 MED ORDER — PNEUMOCOCCAL 13-VAL CONJ VACC IM SUSP
0.5000 mL | Freq: Once | INTRAMUSCULAR | Status: AC
  Administered 2016-06-06: 0.5 mL via INTRAMUSCULAR

## 2016-06-06 NOTE — Assessment & Plan Note (Signed)
Suspect early HTN  ? If some whitecoat DASH diet handout given Disc this and exercise to help control bp  f/u planned for re check and asked to bring his cuff Also will rev labs that day

## 2016-06-06 NOTE — Progress Notes (Signed)
Subjective:    Patient ID: [name], male    DOB: Jul 26, 1950, 66 y.o.   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  BP Readings from Last 3 Encounters:  06/06/16 (!) 160/84  04/19/13 (!) 172/83  09/12/12 140/90   At home 130s/90s- has a cuff/not sure if accurate  High at the dentist office also   Hep C screen-not interested /not high risk   Tetanus shot 5/05- may look into coverage outside the office since he has medicare   PNA vaccine - wants to get his prevnar today   Flu shots - did not get one this year but he usually does  Thinks he did get a mild case of flu   Colonoscopy 11/13 nl 5 y recall with personal hx of rectal cancer  Strong family hx of colon cancer    Hx of hyperlipidemia Lab Results  Component Value Date   CHOL 292 (H) 07/22/2011   HDL 39.30 07/22/2011   LDLDIRECT 216.6 07/22/2011   TRIG 296.0 (H) 07/22/2011   CHOLHDL 7 07/22/2011  due for labs Not really watching his diet   Hx of hyperglycemia Lab Results  Component Value Date   HGBA1C 5.9 06/14/2011  also due for labs  Not watching sugar in diet   Hx of elevated psa Lab Results  Component Value Date   PSA 3.57 06/14/2011   PSA 1.89 09/08/2008   PSA 1.3 06/26/1998  not seeing a urologist  No prostate symptoms or nocturia   He will have a visit from [place] for medicare wellness- soon  Unsure if he wants to do this or AMW here    Patient  Active Problem List   Diagnosis Date Noted  . Lump 06/06/2016  . Elevated blood pressure reading 06/06/2016  . Left lateral epicondylitis 04/06/2012  . Elevated PSA 06/16/2011  . Routine general medical examination at a health care facility 06/16/2011  . Hyperglycemia 06/14/2011  . Prostate cancer screening 06/14/2011  . HYPERLIPIDEMIA 09/08/2008  . ADENOCARCINOMA, RECTUM 03/08/2007  . NEPHROLITHIASIS, HX OF 03/08/2007  . BACK PAIN, LUMBAR 11/27/2006   Past Medical History:  Diagnosis Date  . Diverticulosis   . Hyperlipidemia   . Nephrolithiasis   . rectal ca dx'd 2005   rectal   Past Surgical History:  Procedure Laterality Date  . enlarged prostate  08/2003   on CT scan  . LOW ANTERIOR BOWEL RESECTION     Social History  Substance Use Topics  . Smoking status: Never Smoker  . Smokeless tobacco: Never Used  . Alcohol use 2.4 oz/week    4 Glasses of wine per week     Comment: occ   Family History  Problem Relation Age of Onset  . Cancer Father 17    colon  . Colon cancer Father   .  Cancer Brother     colon  . Colon cancer Brother    Allergies  Allergen Reactions  . Atorvastatin     REACTION: elevated transamines  . Pravastatin Sodium     REACTION: mood problems/irritability/personality change   Current Outpatient Prescriptions on File Prior to Visit  Medication Sig Dispense Refill  . aspirin 81 MG tablet Take 81 mg by mouth daily.    Marland Kitchen ibuprofen (ADVIL,MOTRIN) 200 MG tablet Take 200 mg by mouth every 6 (six) hours as needed for pain.    . Multiple Vitamin (MULTIVITAMIN) tablet Take 1 tablet by mouth daily.     No current facility-administered medications on file prior to visit.     Review of Systems Review of Systems  Constitutional: Negative for fever, appetite change, fatigue and unexpected weight change.  Eyes: Negative for pain and visual disturbance.  Respiratory: Negative for cough and shortness of breath.   Cardiovascular: Negative for cp or  palpitations    Gastrointestinal: Negative for nausea, diarrhea and constipation.  Genitourinary: Negative for urgency and frequency. neg for excessive thirst Skin: Negative for pallor or rash  pos for lump on R foot  Neurological: Negative for weakness, light-headedness, numbness and headaches.  Hematological: Negative for adenopathy. Does not bruise/bleed easily.  Psychiatric/Behavioral: Negative for dysphoric mood. The patient is not nervous/anxious.         Objective:   Physical Exam  Constitutional: He appears well-developed and well-nourished. No distress.  Well appearing   HENT:  Head: Normocephalic and atraumatic.  Right Ear: External ear normal.  Left Ear: External ear normal.  Nose: Nose normal.  Mouth/Throat: Oropharynx is clear and moist.  Eyes: Conjunctivae and EOM are normal. Pupils are equal, round, and reactive to light. Right eye exhibits no discharge. Left eye exhibits no discharge. No scleral icterus.  Neck: Normal range of motion. Neck supple. No JVD present. Carotid bruit is not present. No thyromegaly present.  Cardiovascular: Normal rate, regular rhythm, normal heart sounds and intact distal pulses.  Exam reveals no gallop.   Pulmonary/Chest: Effort normal and breath sounds normal. No respiratory distress. He has no wheezes. He exhibits no tenderness.  Abdominal: Soft. Bowel sounds are normal. He exhibits no distension, no abdominal bruit and no mass. There is no tenderness.  Musculoskeletal: He exhibits no edema or tenderness.  Lymphadenopathy:    He has no cervical adenopathy.  Neurological: He is alert. He has normal reflexes. No cranial nerve deficit. He exhibits normal muscle tone. Coordination normal.  Skin: Skin is warm and dry. No rash noted. No erythema. No pallor.  Some lentigines   2-3 cm round superficial lump on medial R foot that is soft and not mobile nontender     Psychiatric: He has a normal mood and affect.          Assessment &  Plan:   Problem List Items Addressed This Visit      Digestive   ADENOCARCINOMA, RECTUM    Last colonoscopy 11/13 -pt thinks 5 y recall  Will be due in the fall  No symptoms or recent f/u        Other   Elevated blood pressure reading - Primary    Suspect early HTN  ? If some whitecoat DASH diet handout given Disc this and exercise to help control bp  f/u planned for re check and asked to bring his cuff Also will rev labs that day      Relevant Orders   Ambulatory referral to Dermatology  Elevated PSA    Last 3.57 Drawn today  Not seeing urology Lost to f/u since 2014 No symptoms      Hyperglycemia    Overdue for A1C disc imp of low glycemic diet and wt loss to prevent DM2       Relevant Orders   Hemoglobin A1c (Completed)   Hyperlipidemia    In pt with hx of intolerance to statins Disc goals for lipids and reasons to control them Rev labs with pt (from past) Labs drawn today  Rev low sat fat diet in detail -he is not watching diet       Lump    On medial R foot  Unsure if cyst or lipoma -soft in texture Is bothersome to pt- ref to dermatology made       Prostate cancer screening    Medicare psa today  No symptoms  Lost to f/u since 2014      Relevant Orders   PSA, Medicare (Completed)   Routine general medical examination at a health care facility    Reviewed health habits including diet and exercise and skin cancer prevention Reviewed appropriate screening tests for age  Also reviewed health mt list, fam hx and immunization status , as well as social and family history   See HPI Labs ordered Pt unsure if he wants to do AMW visit prevnar vaccine today  He will check into getting tetanus shot in a pharmacy for cost reasons Encouraged him to get a flu shot yearly bp is elevated-f/u planned       Relevant Orders   CBC with Differential/Platelet (Completed)   Comprehensive metabolic panel (Completed)   Lipid panel (Completed)   TSH  (Completed)    Other Visit Diagnoses    Need for vaccination with 13-polyvalent pneumococcal conjugate vaccine       Relevant Medications   pneumococcal 13-valent conjugate vaccine (PREVNAR 13) injection 0.5 mL (Completed)

## 2016-06-06 NOTE — Assessment & Plan Note (Signed)
Reviewed health habits including diet and exercise and skin cancer prevention Reviewed appropriate screening tests for age  Also reviewed health mt list, fam hx and immunization status , as well as social and family history   See HPI Labs ordered Pt unsure if he wants to do AMW visit prevnar vaccine today  He will check into getting tetanus shot in a pharmacy for cost reasons Encouraged him to get a flu shot yearly bp is elevated-f/u planned

## 2016-06-06 NOTE — Assessment & Plan Note (Signed)
Overdue for A1C disc imp of low glycemic diet and wt loss to prevent DM2

## 2016-06-06 NOTE — Assessment & Plan Note (Signed)
Last colonoscopy 11/13 -pt thinks 5 y recall  Will be due in the fall  No symptoms or recent f/u

## 2016-06-06 NOTE — Assessment & Plan Note (Signed)
Medicare psa today  No symptoms  Lost to f/u since 2014

## 2016-06-06 NOTE — Assessment & Plan Note (Signed)
Last 3.57 Drawn today  Not seeing urology Lost to f/u since 2014 No symptoms

## 2016-06-06 NOTE — Progress Notes (Signed)
Pre visit review using our clinic review tool, if applicable. No additional management support is needed unless otherwise documented below in the visit note. 

## 2016-06-06 NOTE — Patient Instructions (Addendum)
You are due for a tetanus shot - here is a handout explaining how to get one  It looks like you will be due for a colonoscopy in November   prevnar vaccine today   Labs today   See handout regarding DASH diet   Follow up in about a month with your blood pressure cuff We will review your labs in more detail at that time   Dermatology referral today

## 2016-06-06 NOTE — Assessment & Plan Note (Signed)
In pt with hx of intolerance to statins Disc goals for lipids and reasons to control them Rev labs with pt (from past) Labs drawn today  Rev low sat fat diet in detail -he is not watching diet

## 2016-06-06 NOTE — Assessment & Plan Note (Signed)
On medial R foot  Unsure if cyst or lipoma -soft in texture Is bothersome to pt- ref to dermatology made

## 2016-06-07 ENCOUNTER — Telehealth: Payer: Self-pay | Admitting: Family Medicine

## 2016-06-07 DIAGNOSIS — R972 Elevated prostate specific antigen [PSA]: Secondary | ICD-10-CM

## 2016-06-07 NOTE — Telephone Encounter (Signed)
Pt returned waynettes message about lab results

## 2016-06-07 NOTE — Telephone Encounter (Signed)
Ref done  Will route to [facility] 

## 2016-06-07 NOTE — Telephone Encounter (Signed)
-----   Message from Tammi Sou, [place] sent at 06/07/2016  4:31 PM EDT ----- Pt notified of his lab results and Dr. Marliss Coots comments and recommendations.   Pt agrees with referral to urologist he wants to see someone in [place], please put referral in and I advise pt our [facility] will call pt to schedule appt

## 2016-06-07 NOTE — Telephone Encounter (Signed)
Addressed through result notes  

## 2016-06-22 NOTE — Telephone Encounter (Signed)
Scheduled 06/29/16 °

## 2016-06-29 ENCOUNTER — Ambulatory Visit (INDEPENDENT_AMBULATORY_CARE_PROVIDER_SITE_OTHER): Payer: Medicare Other | Admitting: Family Medicine

## 2016-06-29 ENCOUNTER — Ambulatory Visit (INDEPENDENT_AMBULATORY_CARE_PROVIDER_SITE_OTHER): Payer: Medicare Other

## 2016-06-29 ENCOUNTER — Encounter: Payer: Self-pay | Admitting: Family Medicine

## 2016-06-29 VITALS — BP 140/80 | HR 99 | Temp 98.7°F | Ht 71.75 in | Wt 194.5 lb

## 2016-06-29 DIAGNOSIS — Z1159 Encounter for screening for other viral diseases: Secondary | ICD-10-CM

## 2016-06-29 DIAGNOSIS — Z0000 Encounter for general adult medical examination without abnormal findings: Secondary | ICD-10-CM | POA: Diagnosis not present

## 2016-06-29 DIAGNOSIS — E7800 Pure hypercholesterolemia, unspecified: Secondary | ICD-10-CM | POA: Diagnosis not present

## 2016-06-29 DIAGNOSIS — R739 Hyperglycemia, unspecified: Secondary | ICD-10-CM

## 2016-06-29 DIAGNOSIS — I10 Essential (primary) hypertension: Secondary | ICD-10-CM

## 2016-06-29 DIAGNOSIS — R972 Elevated prostate specific antigen [PSA]: Secondary | ICD-10-CM | POA: Diagnosis not present

## 2016-06-29 LAB — LIPID PANEL
CHOL/HDL RATIO: 7
Cholesterol: 335 mg/dL — ABNORMAL HIGH (ref 0–200)
HDL: 49.9 mg/dL (ref >39.00)

## 2016-06-29 LAB — LDL CHOLESTEROL, DIRECT: Direct LDL: 225 mg/dL

## 2016-06-29 NOTE — Progress Notes (Signed)
Pre visit review using our clinic review tool, if applicable. No additional management support is needed unless otherwise documented below in the visit note. 

## 2016-06-29 NOTE — Patient Instructions (Signed)
[name] , Thank you for taking time to come for your Medicare Wellness Visit. I appreciate your ongoing commitment to your health goals. Please review the following plan we discussed and let me know if I can assist you in the future.   These are the goals we discussed: Goals    . 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        This is a list of the screening recommended for you and due dates:  Health Maintenance  Topic Date Due  . Tetanus Vaccine  07/06/2023*  . Flu Shot  09/28/2016  . Colon Cancer Screening  01/23/2017  . Pneumonia vaccines (2 of 2 - PPSV23) 06/06/2017  .  Hepatitis C: One time screening is recommended by Center for Disease Control  (CDC) for  adults born from [birth year] through 1966.   Completed  *Topic was postponed. The date shown is not the original due date.   Preventive Care for Adults  A healthy lifestyle and preventive care can promote health and wellness. Preventive health guidelines for adults include the following key practices.  . A routine yearly physical is a good way to check with your health care provider about your health and preventive screening. It is a chance to share any concerns and updates on your health and to receive a thorough exam.  . Visit your dentist for a routine exam and preventive care every 6 months. Brush your teeth twice a day and floss once a day. Good oral hygiene prevents tooth decay and gum disease.  . The frequency of eye exams is based on your age, health, family medical history, use  of contact lenses, and other factors. Follow your health care provider's ecommendations for frequency of eye exams.  . Eat a healthy diet. Foods like vegetables, fruits, whole grains, low-fat dairy products, and lean protein foods contain the nutrients you need without too many calories. Decrease your intake of foods high in solid fats, added  sugars, and salt. Eat the right amount of calories for you. Get information about a proper diet from your health care provider, if necessary.  . Regular physical exercise is one of the most important things you can do for your health. Most adults should get at least 150 minutes of moderate-intensity exercise (any activity that increases your heart rate and causes you to sweat) each week. In addition, most adults need muscle-strengthening exercises on 2 or more days a week.  Silver Sneakers may be a benefit available to you. To determine eligibility, you may visit the website: www.silversneakers.com or contact program at 952-176-4707 Mon-Fri between 8AM-8PM.   . Maintain a healthy weight. The body mass index (BMI) is a screening tool to identify possible weight problems. It provides an estimate of body fat based on height and weight. Your health care provider can find your BMI and can help you achieve or maintain a healthy weight.   For adults 20 years and older: ? A BMI below 18.5 is considered underweight. ? A BMI of 18.5 to 24.9 is normal. ? A BMI of 25 to 29.9 is considered overweight. ? A BMI of 30 and above is considered obese.   . Maintain normal blood lipids and cholesterol levels by exercising and minimizing your intake of saturated fat. Eat a balanced diet with plenty of fruit and vegetables. Blood tests  for lipids and cholesterol should begin at age 92 and be repeated every 5 years. If your lipid or cholesterol levels are high, you are over 50, or you are at high risk for heart disease, you may need your cholesterol levels checked more frequently. Ongoing high lipid and cholesterol levels should be treated with medicines if diet and exercise are not working.  . If you smoke, find out from your health care provider how to quit. If you do not use tobacco, please do not start.  . If you choose to drink alcohol, please do not consume more than 2 drinks per day. One drink is considered to  be 12 ounces (355 mL) of beer, 5 ounces (148 mL) of wine, or 1.5 ounces (44 mL) of liquor.  . If you are 21-49 years old, ask your health care provider if you should take aspirin to prevent strokes.  . Use sunscreen. Apply sunscreen liberally and repeatedly throughout the day. You should seek shade when your shadow is shorter than you. Protect yourself by wearing long sleeves, pants, a wide-brimmed hat, and sunglasses year round, whenever you are outdoors.  . Once a month, do a whole body skin exam, using a mirror to look at the skin on your back. Tell your health care provider of new moles, moles that have irregular borders, moles that are larger than a pencil eraser, or moles that have changed in shape or color.

## 2016-06-29 NOTE — Progress Notes (Signed)
Subjective:   [name] is a 66 y.o. male who presents for an Initial Medicare Annual Wellness Visit.  Review of Systems  N/A Cardiac Risk Factors include: advanced age (>58men, >27 women);male gender;hypertension    Objective:    Today's Vitals   06/29/16 1441  BP: 140/80  Pulse: 99  Temp: 98.7 F (37.1 C)  TempSrc: Oral  SpO2: 96%  Weight: 194 lb 8 oz (88.2 kg)  Height: 5' 11.75" (1.822 m)  PainSc: 0-No pain   Body mass index is 26.56 kg/m.  Current Medications (verified) Outpatient Encounter Prescriptions as of 06/29/2016  Medication Sig  . aspirin 81 MG tablet Take 81 mg by mouth daily.  Marland Kitchen ibuprofen (ADVIL,MOTRIN) 200 MG tablet Take 200 mg by mouth every 6 (six) hours as needed for pain.  . Multiple Vitamin (MULTIVITAMIN) tablet Take 1 tablet by mouth daily.   No facility-administered encounter medications on file as of 06/29/2016.     Allergies (verified) Atorvastatin and Pravastatin sodium   History: Past Medical History:  Diagnosis Date  . Diverticulosis   . Hyperlipidemia   . Nephrolithiasis   . rectal ca dx'd 2005   rectal   Past Surgical History:  Procedure Laterality Date  . enlarged prostate  08/2003   on CT scan  . GANGLION CYST EXCISION Right 05/2016  . LOW ANTERIOR BOWEL RESECTION     Family History  Problem Relation Age of Onset  . Cancer Father 74    colon  . Colon cancer Father   . Cancer Brother     colon  . Colon cancer Brother    Social History   Occupational History  . [employer] [place] History Main Topics  . Smoking status: Never Smoker  . Smokeless tobacco: Never Used  . Alcohol use 2.4 oz/week    4 Glasses of wine per week     Comment: occ  . Drug use: No  . Sexual activity: Not on file   Tobacco Counseling Counseling given: No   Activities of Daily Living In your present state of health, do you have any difficulty performing the following activities: 06/29/2016  Hearing? Y  Vision? N   Difficulty concentrating or making decisions? N  Walking or climbing stairs? N  Dressing or bathing? N  Doing errands, shopping? N  Preparing Food and eating ? N  Using the Toilet? N  In the past six months, have you accidently leaked urine? N  Do you have problems with loss of bowel control? N  Managing your Medications? N  Managing your Finances? N  Housekeeping or managing your Housekeeping? N  Some recent data might be hidden    Immunizations and Health Maintenance Immunization History  Administered Date(s) Administered  . Pneumococcal Conjugate-13 06/06/2016  . Td 07/07/2003   There are no preventive care reminders to display for this patient.  Patient Care Team: Abner Greenspan, MD as PCP - General    Assessment:   This is a routine wellness examination for [name].   Hearing/Vision screen  Hearing Screening   125Hz  250Hz  500Hz  1000Hz  2000Hz  3000Hz  4000Hz  6000Hz  8000Hz   Right ear:   40 40 40  0    Left ear:   40 40 40  0      Visual Acuity Screening   Right eye Left eye Both eyes  Without correction: 20/20 20/20 20/15   With correction:       Dietary issues and exercise activities discussed: Current Exercise Habits:  Home exercise routine, Type of exercise: walking;Other - see comments (core exercises, golfing), Time (Minutes): 45, Frequency (Times/Week): 7, Weekly Exercise (Minutes/Week): 315, Intensity: Mild, Exercise limited by: None identified  Goals    . 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       Depression Screen PHQ 2/9 Scores 06/29/2016  PHQ - 2 Score 0    Fall Risk Fall Risk  06/29/2016  Falls in the past year? No    Cognitive Function: MMSE - Mini Mental State Exam 06/29/2016  Orientation to time 5  Orientation to Place 5  Registration 3  Attention/ Calculation 0  Recall 3  Language- name 2 objects 0  Language- repeat 1  Language-  follow 3 step command 3  Language- read & follow direction 0  Write a sentence 0  Copy design 0  Total score 20     PLEASE NOTE: A Mini-Cog screen was completed. Maximum score is 20. A value of 0 denotes this part of Folstein MMSE was not completed or the patient failed this part of the Mini-Cog screening.   Mini-Cog Screening Orientation to Time - Max 5 pts Orientation to Place - Max 5 pts Registration - Max 3 pts Recall - Max 3 pts Language Repeat - Max 1 pts Language Follow 3 Step Command - Max 3 pts     Screening Tests Health Maintenance  Topic Date Due  . TETANUS/TDAP  07/06/2023 (Originally 07/06/2013)  . INFLUENZA VACCINE  09/28/2016  . COLONOSCOPY  01/23/2017  . PNA vac Low Risk Adult (2 of 2 - PPSV23) 06/06/2017  . Hepatitis C Screening  Completed        Plan:     I have personally reviewed and addressed the Medicare Annual Wellness questionnaire and have noted the following in the patient's chart:  A. Medical and social history B. Use of alcohol, tobacco or illicit drugs  C. Current medications and supplements D. Functional ability and status E.  Nutritional status F.  Physical activity G. Advance directives H. List of other physicians I.  Hospitalizations, surgeries, and ER visits in previous 12 months J.  Alger to include hearing, vision, cognitive, depression L. Referrals and appointments - none  In addition, I have reviewed and discussed with patient certain preventive protocols, quality metrics, and best practice recommendations. A written personalized care plan for preventive services as well as general preventive health recommendations were provided to patient.  See attached scanned questionnaire for additional information.   Signed,   Lindell Noe, MHA, BS, LPN Health Coach

## 2016-06-29 NOTE — Patient Instructions (Signed)
Cholesterol check today Keep working hard on diet and exercise - you are doing great  This is good for blood sugar and also blood pressure  You have mild essential hypertension (high blood pressure) Let's see how lifestyle change helps in the next 6 months  Keep away from processed foods   My favorite meter is OMRON for arm- size regular   Follow up in 6 months

## 2016-06-29 NOTE — Progress Notes (Signed)
PCP notes:   Health maintenance:  Tetanus - postponed/insurance Hep C screening - completed  Abnormal screenings:   Hearing - failed  Patient concerns:   None  Nurse concerns:  None  Next PCP appt:   12/30/16 @ 0900  I reviewed health advisor's note, was available for consultation, and agree with documentation and plan. Loura Pardon MD

## 2016-06-29 NOTE — Progress Notes (Signed)
Subjective:    Patient ID: [name], male    DOB: Sep 11, 1950, 65 y.o.   MRN: [id number]  HPI Here for f/u of chronic medical problems   Wt Readings from Last 3 Encounters:  06/29/16 194 lb 8 oz (88.2 kg)  06/06/16 194 lb 8 oz (88.2 kg)  04/19/13 200 lb (90.7 kg)    Last visit bp was elevated -disc lifestyle change and DASH eating plan and exercise  BP Readings from Last 3 Encounters:  06/29/16 (!) 156/86  06/06/16 (!) 160/84  04/19/13 (!) 172/83   Today his wrist bp cuff shows 148/102 At home in 130s-140s/ 90 or below diastolic  He is eating better- cut out red meat and bacon and sausage and eggs  Eating a lot of veg/fruit/fish  Walking and calisthenics   Cholesterol Lab Results  Component Value Date   CHOL 324 (H) 06/06/2016   HDL 51.70 06/06/2016   LDLDIRECT 214.0 06/06/2016   TRIG 337.0 (H) 06/06/2016   CHOLHDL 6 06/06/2016  he has been intolerant to atorvastatin and pravastatin in the past  Elevated transaminases and mood change  Sister has very high cholesterol as well  Cut out a lot of fats   Would like to re check this with better diet     Hyperglycemia Lab Results  Component Value Date   HGBA1C 6.2 06/06/2016  diet is much better now  Less processed foods and carbs  Up from 5.9   Was ref to urology for inc psa  Lab Results  Component Value Date   PSA 5.87 (H) 06/06/2016   PSA 3.57 06/14/2011   PSA 1.89 09/08/2008   Has urology appt is coming up in June   Patient Active Problem List   Diagnosis Date Noted  . Essential hypertension 06/06/2016  . Elevated PSA 06/16/2011  . Routine general medical examination at a health care facility 06/16/2011  . Hyperglycemia 06/14/2011  . Prostate cancer screening 06/14/2011  . Hyperlipidemia 09/08/2008  . ADENOCARCINOMA, RECTUM 03/08/2007  . NEPHROLITHIASIS, HX OF 03/08/2007   Past Medical History:  Diagnosis Date  . Diverticulosis   . Hyperlipidemia   . Nephrolithiasis   . rectal ca dx'd  2005   rectal   Past Surgical History:  Procedure Laterality Date  . enlarged prostate  08/2003   on CT scan  . GANGLION CYST EXCISION Right 05/2016  . LOW ANTERIOR BOWEL RESECTION     Social History  Substance Use Topics  . Smoking status: Never Smoker  . Smokeless tobacco: Never Used  . Alcohol use 2.4 oz/week    4 Glasses of wine per week     Comment: occ   Family History  Problem Relation Age of Onset  . Cancer Father 69    colon  . Colon cancer Father   . Cancer Brother     colon  . Colon cancer Brother    Allergies  Allergen Reactions  . Atorvastatin     REACTION: elevated transamines  . Pravastatin Sodium     REACTION: mood problems/irritability/personality change   Current Outpatient Prescriptions on File Prior to Visit  Medication Sig Dispense Refill  . aspirin 81 MG tablet Take 81 mg by mouth daily.    Marland Kitchen ibuprofen (ADVIL,MOTRIN) 200 MG tablet Take 200 mg by mouth every 6 (six) hours as needed for pain.    . Multiple Vitamin (MULTIVITAMIN) tablet Take 1 tablet by mouth daily.     No current facility-administered medications on file prior to visit.  Review of Systems Review of Systems  Constitutional: Negative for fever, appetite change, fatigue and unexpected weight change.  Eyes: Negative for pain and visual disturbance.  Respiratory: Negative for cough and shortness of breath.   Cardiovascular: Negative for cp or palpitations    Gastrointestinal: Negative for nausea, diarrhea and constipation.  Genitourinary: Negative for urgency and frequency.  Skin: Negative for pallor or rash   Neurological: Negative for weakness, light-headedness, numbness and headaches.  Hematological: Negative for adenopathy. Does not bruise/bleed easily.  Psychiatric/Behavioral: Negative for dysphoric mood. The patient is not nervous/anxious.         Objective:   Physical Exam  Constitutional: He appears well-developed and well-nourished. No distress.  Well appearing    HENT:  Head: Normocephalic and atraumatic.  Mouth/Throat: Oropharynx is clear and moist.  Eyes: Conjunctivae and EOM are normal. Pupils are equal, round, and reactive to light.  Neck: Normal range of motion. Neck supple. No JVD present. Carotid bruit is not present. No thyromegaly present.  Cardiovascular: Normal rate, regular rhythm, normal heart sounds and intact distal pulses.  Exam reveals no gallop.   Pulmonary/Chest: Effort normal and breath sounds normal. No respiratory distress. He has no wheezes. He has no rales.  No crackles  Abdominal: Soft. Bowel sounds are normal. He exhibits no distension, no abdominal bruit and no mass. There is no tenderness.  Musculoskeletal: He exhibits no edema.  Lymphadenopathy:    He has no cervical adenopathy.  Neurological: He is alert. He has normal reflexes.  Skin: Skin is warm and dry. No rash noted. No pallor.  Psychiatric: He has a normal mood and affect.          Assessment & Plan:   Problem List Items Addressed This Visit      Cardiovascular and Mediastinum   Essential hypertension    Rev our and his meter readings  Recommended another meter  Doing well with lifestyle change incl DASH eating  BP: 140/80  He prefers not to start medication now-will work on lifestyle change 6 more mo  At that time will tx if not at goal         Other   Elevated PSA    Lab Results  Component Value Date   PSA 5.87 (H) 06/06/2016   PSA 3.57 06/14/2011   PSA 1.89 09/08/2008   No symptoms  Pending upcoming urology eval        Hyperglycemia    Lab Results  Component Value Date   HGBA1C 6.2 06/06/2016   Working hard on better diet disc imp of low glycemic diet and wt loss to prevent DM2 Will re check 6 mo      Hyperlipidemia - Primary    Very high LDL over 200 Disc goals for lipids and reasons to control them Rev labs with pt Rev low sat fat diet in detail  I suspect he will need medication (unfortunately intol of 2 statins)  Re  check today with better diet If not improved -disc opt of trial or low dose crestor vs cardiology ref to disc PCYK9 inhibitor (may be a candidate- unsure if could get financial help)  Given handout on [employer] to review      Relevant Orders   Lipid panel (Completed)

## 2016-06-30 LAB — HEPATITIS C ANTIBODY: HCV AB: NEGATIVE

## 2016-06-30 NOTE — Assessment & Plan Note (Signed)
Very high LDL over 200 Disc goals for lipids and reasons to control them Rev labs with pt Rev low sat fat diet in detail  I suspect he will need medication (unfortunately intol of 2 statins)  Re check today with better diet If not improved -disc opt of trial or low dose crestor vs cardiology ref to disc PCYK9 inhibitor (may be a candidate- unsure if could get financial help)  Given handout on [employer] to review

## 2016-06-30 NOTE — Assessment & Plan Note (Signed)
Rev our and his meter readings  Recommended another meter  Doing well with lifestyle change incl DASH eating  BP: 140/80  He prefers not to start medication now-will work on lifestyle change 6 more mo  At that time will tx if not at goal

## 2016-06-30 NOTE — Assessment & Plan Note (Signed)
Lab Results  Component Value Date   PSA 5.87 (H) 06/06/2016   PSA 3.57 06/14/2011   PSA 1.89 09/08/2008   No symptoms  Pending upcoming urology eval

## 2016-06-30 NOTE — Assessment & Plan Note (Signed)
Lab Results  Component Value Date   HGBA1C 6.2 06/06/2016   Working hard on better diet disc imp of low glycemic diet and wt loss to prevent DM2 Will re check 6 mo

## 2016-07-06 ENCOUNTER — Ambulatory Visit: Payer: Medicare Other | Admitting: Family Medicine

## 2016-11-15 ENCOUNTER — Emergency Department (HOSPITAL_COMMUNITY)
Admission: EM | Admit: 2016-11-15 | Discharge: 2016-11-15 | Disposition: A | Discharge status: Home / Self Care | Payer: Medicare Other | Attending: Emergency Medicine | Admitting: Emergency Medicine

## 2016-11-15 ENCOUNTER — Encounter (HOSPITAL_COMMUNITY): Payer: Self-pay | Admitting: *Deleted

## 2016-11-15 ENCOUNTER — Other Ambulatory Visit: Payer: Self-pay

## 2016-11-15 ENCOUNTER — Emergency Department (HOSPITAL_COMMUNITY): Payer: Medicare Other

## 2016-11-15 DIAGNOSIS — R112 Nausea with vomiting, unspecified: Secondary | ICD-10-CM | POA: Diagnosis not present

## 2016-11-15 DIAGNOSIS — E785 Hyperlipidemia, unspecified: Secondary | ICD-10-CM | POA: Diagnosis not present

## 2016-11-15 DIAGNOSIS — Z7982 Long term (current) use of aspirin: Secondary | ICD-10-CM | POA: Diagnosis not present

## 2016-11-15 DIAGNOSIS — R55 Syncope and collapse: Secondary | ICD-10-CM | POA: Diagnosis not present

## 2016-11-15 DIAGNOSIS — Z791 Long term (current) use of non-steroidal anti-inflammatories (NSAID): Secondary | ICD-10-CM | POA: Diagnosis not present

## 2016-11-15 DIAGNOSIS — Z79899 Other long term (current) drug therapy: Secondary | ICD-10-CM | POA: Diagnosis not present

## 2016-11-15 DIAGNOSIS — I10 Essential (primary) hypertension: Secondary | ICD-10-CM | POA: Insufficient documentation

## 2016-11-15 DIAGNOSIS — R0602 Shortness of breath: Secondary | ICD-10-CM | POA: Diagnosis present

## 2016-11-15 LAB — BASIC METABOLIC PANEL
ANION GAP: 14 (ref 5–15)
BUN: 15 mg/dL (ref 6–20)
CO2: 23 mmol/L (ref 22–32)
Calcium: 9.6 mg/dL (ref 8.9–10.3)
Chloride: 102 mmol/L (ref 101–111)
Creatinine, Ser: 1.05 mg/dL (ref 0.61–1.24)
GFR calc Af Amer: >60 mL/min (ref >60)
GFR calc non Af Amer: >60 mL/min (ref >60)
GLUCOSE: 135 mg/dL — AB (ref 65–99)
POTASSIUM: 3.8 mmol/L (ref 3.5–5.1)
Sodium: 139 mmol/L (ref 135–145)

## 2016-11-15 LAB — URINALYSIS, ROUTINE W REFLEX MICROSCOPIC
BILIRUBIN URINE: NEGATIVE
Glucose, UA: NEGATIVE mg/dL
HGB URINE DIPSTICK: NEGATIVE
Ketones, ur: 5 mg/dL — AB
Leukocytes, UA: NEGATIVE
Nitrite: NEGATIVE
PH: 5 (ref 5.0–8.0)
Protein, ur: NEGATIVE mg/dL
SPECIFIC GRAVITY, URINE: 1.023 (ref 1.005–1.030)

## 2016-11-15 LAB — TROPONIN I: Troponin I: <0.03 ng/mL (ref <0.03)

## 2016-11-15 LAB — CBC
HEMATOCRIT: 46.1 % (ref 39.0–52.0)
HEMOGLOBIN: 16.1 g/dL (ref 13.0–17.0)
MCH: 30.9 pg (ref 26.0–34.0)
MCHC: 34.9 g/dL (ref 30.0–36.0)
MCV: 88.5 fL (ref 78.0–100.0)
Platelets: 254 10*3/uL (ref 150–400)
RBC: 5.21 MIL/uL (ref 4.22–5.81)
RDW: 12.2 % (ref 11.5–15.5)
WBC: 8.9 10*3/uL (ref 4.0–10.5)

## 2016-11-15 LAB — LIPASE, BLOOD: Lipase: 41 U/L (ref 11–51)

## 2016-11-15 LAB — HEPATIC FUNCTION PANEL
ALT: 30 U/L (ref 17–63)
AST: 25 U/L (ref 15–41)
Albumin: 4 g/dL (ref 3.5–5.0)
Alkaline Phosphatase: 94 U/L (ref 38–126)
BILIRUBIN TOTAL: 0.7 mg/dL (ref 0.3–1.2)
Total Protein: 7.5 g/dL (ref 6.5–8.1)

## 2016-11-15 LAB — I-STAT TROPONIN, ED: Troponin i, poc: 0 ng/mL (ref 0.00–0.08)

## 2016-11-15 MED ORDER — ONDANSETRON HCL 4 MG/2ML IJ SOLN
4.0000 mg | Freq: Once | INTRAMUSCULAR | Status: AC | PRN
  Administered 2016-11-15: 4 mg via INTRAVENOUS

## 2016-11-15 MED ORDER — PROMETHAZINE HCL 25 MG PO TABS: 25.0000 mg | ORAL_TABLET | Freq: Four times a day (QID) | ORAL | 0 refills | Status: DC | PRN

## 2016-11-15 MED ORDER — PROMETHAZINE HCL 25 MG/ML IJ SOLN
12.5000 mg | Freq: Once | INTRAMUSCULAR | Status: AC
  Administered 2016-11-15: 12.5 mg via INTRAVENOUS
  Filled 2016-11-15: qty 1

## 2016-11-15 MED ORDER — ONDANSETRON HCL 4 MG/2ML IJ SOLN
INTRAMUSCULAR | Status: AC
  Filled 2016-11-15: qty 2

## 2016-11-15 MED ORDER — ONDANSETRON HCL 4 MG/2ML IJ SOLN
4.0000 mg | Freq: Once | INTRAMUSCULAR | Status: AC
  Administered 2016-11-15: 4 mg via INTRAVENOUS
  Filled 2016-11-15: qty 2

## 2016-11-15 MED ORDER — SODIUM CHLORIDE 0.9 % IV BOLUS (SEPSIS)
1000.0000 mL | Freq: Once | INTRAVENOUS | Status: AC
  Administered 2016-11-15: 1000 mL via INTRAVENOUS

## 2016-11-15 MED ORDER — ONDANSETRON 4 MG PO TBDP: 4.0000 mg | ORAL_TABLET | Freq: Three times a day (TID) | ORAL | 0 refills | Status: DC | PRN

## 2016-11-15 NOTE — ED Provider Notes (Signed)
[facility] DEPT Provider Note   CSN: [id number] Arrival date & time: 11/15/16  0134     History   Chief Complaint Chief Complaint  Patient presents with  . Shortness of Breath  . Near Syncope    HPI [name] is a 66 y.o. male.  Patient woke from sleep with nausea, dizziness, lightheadedness and diaphoresis. Has since had multiple symptoms of vomiting. Wife reports he was uncomfortable-appearing and pale. Normal during the day yesterday. No recent sick contacts. He did eat elk for the first time 2 days ago. No fever. No chest pain or abdominal pain. No cardiac history. Does not have any chronic medical conditions or take any medications. no focal weakness, numbness or tingling. No difficulty breathing or difficulty swallowing.    The history is provided by the patient and the spouse.  Shortness of Breath  Associated symptoms include vomiting. Pertinent negatives include no fever, no headaches, no rhinorrhea, no chest pain, no abdominal pain and no rash.  Near Syncope  Associated symptoms include shortness of breath. Pertinent negatives include no chest pain, no abdominal pain and no headaches.    Past Medical History:  Diagnosis Date  . Diverticulosis   . Hyperlipidemia   . Nephrolithiasis   . rectal ca dx'd 2005   rectal    Patient Active Problem List   Diagnosis Date Noted  . Essential hypertension 06/06/2016  . Elevated PSA 06/16/2011  . Routine general medical examination at a health care facility 06/16/2011  . Hyperglycemia 06/14/2011  . Prostate cancer screening 06/14/2011  . Hyperlipidemia 09/08/2008  . ADENOCARCINOMA, RECTUM 03/08/2007  . NEPHROLITHIASIS, HX OF 03/08/2007    Past Surgical History:  Procedure Laterality Date  . enlarged prostate  08/2003   on CT scan  . GANGLION CYST EXCISION Right 05/2016  . LOW ANTERIOR BOWEL RESECTION         Home Medications    Prior to Admission medications   Medication Sig Start Date End Date  Taking? Authorizing Provider  aspirin 81 MG tablet Take 81 mg by mouth daily.    [provider]  ibuprofen (ADVIL,MOTRIN) 200 MG tablet Take 200 mg by mouth every 6 (six) hours as needed for pain.    [provider]  Multiple Vitamin (MULTIVITAMIN) tablet Take 1 tablet by mouth daily.    [provider]    Family History Family History  Problem Relation Age of Onset  . Cancer Father 40       colon  . Colon cancer Father   . Cancer Brother        colon  . Colon cancer Brother     Social History Social History  Substance Use Topics  . Smoking status: Never Smoker  . Smokeless tobacco: Never Used  . Alcohol use 2.4 oz/week    4 Glasses of wine per week     Comment: occ     Allergies   Atorvastatin and Pravastatin sodium   Review of Systems Review of Systems  Constitutional: Positive for activity change, appetite change and fatigue. Negative for fever.  HENT: Negative for congestion and rhinorrhea.   Respiratory: Positive for shortness of breath. Negative for chest tightness.   Cardiovascular: Positive for near-syncope. Negative for chest pain.  Gastrointestinal: Positive for nausea and vomiting. Negative for abdominal pain.  Genitourinary: Negative for dysuria, genital sores, hematuria and testicular pain.  Musculoskeletal: Negative for arthralgias and myalgias.  Skin: Negative for rash.  Neurological: Positive for dizziness and light-headedness. Negative for seizures,  speech difficulty, weakness and headaches.   all other systems are negative except as noted in the HPI and PMH.     Physical Exam Updated Vital Signs BP (!) 148/81   Pulse 66   Temp 98.1 F (36.7 C) (Oral)   Resp 12   Ht 6' (1.829 m)   Wt 86.2 kg (190 lb)   SpO2 95%   BMI 25.77 kg/m   Physical Exam  Constitutional: He is oriented to person, place, and time. He appears well-developed and well-nourished. No distress.  HENT:  Head: Normocephalic and atraumatic.    Mouth/Throat: Oropharynx is clear and moist. No oropharyngeal exudate.  Eyes: Pupils are equal, round, and reactive to light. Conjunctivae and EOM are normal.  Neck: Normal range of motion. Neck supple.  No meningismus.  Cardiovascular: Normal rate, regular rhythm, normal heart sounds and intact distal pulses.   No murmur heard. Pulmonary/Chest: Effort normal and breath sounds normal. No respiratory distress. He exhibits no tenderness.  Abdominal: Soft. There is no tenderness. There is no rebound and no guarding.  Musculoskeletal: Normal range of motion. He exhibits no edema or tenderness.  Neurological: He is alert and oriented to person, place, and time. No cranial nerve deficit. He exhibits normal muscle tone. Coordination normal.   5/5 strength throughout. CN 2-12 intact.Equal grip strength.   Skin: Skin is warm.  Psychiatric: He has a normal mood and affect. His behavior is normal.  Nursing note and vitals reviewed.    ED Treatments / Results  Labs (all labs ordered are listed, but only abnormal results are displayed) Labs Reviewed  BASIC METABOLIC PANEL - Abnormal; Notable for the following:       Result Value   Glucose, Bld 135 (*)    All other components within normal limits  HEPATIC FUNCTION PANEL - Abnormal; Notable for the following:    Bilirubin, Direct <0.1 (*)    All other components within normal limits  URINALYSIS, ROUTINE W REFLEX MICROSCOPIC - Abnormal; Notable for the following:    Ketones, ur 5 (*)    All other components within normal limits  CBC  LIPASE, BLOOD  TROPONIN I  TROPONIN I  I-STAT TROPONIN, ED    EKG  EKG Interpretation  Date/Time:  Tuesday November 15 2016 01:36:22 EDT Ventricular Rate:  82 PR Interval:  134 QRS Duration: 90 QT Interval:  382 QTC Calculation: 446 R Axis:   -22 Text Interpretation:  Normal sinus rhythm Normal ECG No significant change was found Confirmed by Ezequiel Essex [phone]) on 11/15/2016 3:18:16 AM        Radiology Dg Chest 2 View  Result Date: 11/15/2016 CLINICAL DATA:  Shortness of breath EXAM: CHEST  2 VIEW COMPARISON:  CT 04/16/2012 FINDINGS: No acute consolidation or effusion. Mild hyperinflation. Normal cardiomediastinal silhouette. No pneumothorax. IMPRESSION: No active cardiopulmonary disease. Electronically Signed   By: Donavan Foil M.D.   On: 11/15/2016 02:35    Procedures Procedures (including critical care time)  Medications Ordered in ED Medications  sodium chloride 0.9 % bolus 1,000 mL (1,000 mLs Intravenous New Bag/Given 11/15/16 0343)  ondansetron (ZOFRAN) injection 4 mg (4 mg Intravenous Given 11/15/16 0217)  ondansetron (ZOFRAN) injection 4 mg (4 mg Intravenous Given 11/15/16 0343)     Initial Impression / Assessment and Plan / ED Course  I have reviewed the triage vital signs and the nursing notes.  Pertinent labs & imaging results that were available during my care of the patient were reviewed by me and considered  in my medical decision making (see chart for details).    Patient woke from sleep with nausea, diaphoresis, vomiting, shortness of breath. No chest pain or abdominal pain. EKG is normal sinus rhythm  Patient given IV fluids and antiemetics. Labs are reassuring. Troponin negative. LFTs and lipase normal. Patient continues to have no abdominal pain no chest pain.  Feels improved after antiemetics and IV fluids. He is tolerating by mouth. Abdomen is soft. Troponin is negative 2. Discussed with patient cardiac suspicion is low but not 0. He does not want to be admitted to the hospital. She agrees to third troponin to effectively rule out ACS. No chest pain or shortness of breath. He is tolerating by mouth without difficulty. His nausea has improved.  Troponin pending at time of sign out to Dr. Vanita Panda.   Final Clinical Impressions(s) / ED Diagnoses   Final diagnoses:  None    New Prescriptions New Prescriptions   No medications on file      Ezequiel Essex, MD 11/15/16 [phone]

## 2016-11-15 NOTE — ED Notes (Signed)
Pt vomiting profusely in triage

## 2016-11-15 NOTE — ED Triage Notes (Signed)
Pt woke up this morning with SOB, diaphoresis, dizziness, and nausea. Pt denies pain, reports feeling "like something isn't right." Pt appears uncomfortable and pale

## 2016-11-15 NOTE — ED Notes (Signed)
Patient given ginger ale, tolerated well

## 2016-11-15 NOTE — ED Notes (Signed)
Patient states  He is feeling better sleepy.

## 2016-11-15 NOTE — ED Notes (Signed)
Denies pain c/o dizziness states he has vertigo however this is different

## 2016-11-15 NOTE — ED Notes (Signed)
MD at bedside. 

## 2016-11-15 NOTE — ED Notes (Signed)
Pt able to ambulate with only some slight dizziness

## 2016-11-15 NOTE — Discharge Instructions (Signed)
Keep yourself hydrated. Follow up with your doctor. Return to the ED if you develop chest pain, abdominal pain, persistent vomiting, or any other concerns.

## 2016-12-30 ENCOUNTER — Ambulatory Visit (INDEPENDENT_AMBULATORY_CARE_PROVIDER_SITE_OTHER): Payer: Medicare Other | Admitting: Family Medicine

## 2016-12-30 ENCOUNTER — Encounter: Payer: Self-pay | Admitting: Family Medicine

## 2016-12-30 ENCOUNTER — Ambulatory Visit: Payer: Medicare Other

## 2016-12-30 VITALS — BP 155/80 | HR 65 | Temp 97.6°F | Ht 71.75 in | Wt 190.0 lb

## 2016-12-30 DIAGNOSIS — I10 Essential (primary) hypertension: Secondary | ICD-10-CM | POA: Diagnosis not present

## 2016-12-30 DIAGNOSIS — Z23 Encounter for immunization: Secondary | ICD-10-CM

## 2016-12-30 DIAGNOSIS — E782 Mixed hyperlipidemia: Secondary | ICD-10-CM | POA: Diagnosis not present

## 2016-12-30 DIAGNOSIS — R739 Hyperglycemia, unspecified: Secondary | ICD-10-CM

## 2016-12-30 LAB — LIPID PANEL
CHOL/HDL RATIO: 7
Cholesterol: 331 mg/dL — ABNORMAL HIGH (ref 0–200)
HDL: 48 mg/dL (ref >39.00)
NonHDL: 283.22
Triglycerides: 277 mg/dL — ABNORMAL HIGH (ref 0.0–149.0)
VLDL: 55.4 mg/dL — AB (ref 0.0–40.0)

## 2016-12-30 LAB — LDL CHOLESTEROL, DIRECT: LDL DIRECT: 234 mg/dL

## 2016-12-30 LAB — HEMOGLOBIN A1C: HEMOGLOBIN A1C: 6 % (ref 4.6–6.5)

## 2016-12-30 NOTE — Assessment & Plan Note (Signed)
bp in fair control at this time at home but high here His cuff runs a little higher  bp at home are 130s/70s  BP Readings from Last 1 Encounters:  12/30/16 (!) 155/80   No changes needed-will continue to manage with DASH eating and exercise  Continue to monitor at home Disc correct way to take bp  He will bring his monitor each visit as well to compare

## 2016-12-30 NOTE — Progress Notes (Signed)
Subjective:    Patient ID: [name], male    DOB: 1950/11/19, 66 y.o.   MRN: [id number]  HPI Here for f/u of chronic health problems   Had incident in sept - woke up feeling very sick/went to the ED  Nothing GI except nausea/dry mouth  (dry heaves) Thought if may be bad vertigo  Gave nausea med/crashed for 2 days   Other than that doing fine    Wt Readings from Last 3 Encounters:  12/30/16 190 lb (86.2 kg)  11/15/16 190 lb (86.2 kg)  06/29/16 194 lb 8 oz (88.2 kg)  eats healthy and gets fair exercise  25.95 kg/m   Wants flu shot today   bp is elevated today  No cp or palpitations or headaches or edema  No side effects to medicines  BP Readings from Last 3 Encounters:  12/30/16 (!) 152/94  11/15/16 139/83  06/29/16 140/80     On his meter today 161/108 bp has been very good at home - 132/77, 134/77 - that general range  It may go up a few points  Thinks his bp is white coat syndrome  Hyperglycemia Lab Results  Component Value Date   HGBA1C 6.2 06/06/2016  he is careful with carbs Does have a sweet tooth   Hyperlipidemia Lab Results  Component Value Date   CHOL 335 (H) 06/29/2016   HDL 49.90 06/29/2016   LDLDIRECT 225.0 06/29/2016   TRIG (H) 06/29/2016    410.0 Triglyceride is over 400; calculations on Lipids are invalid.   CHOLHDL 7 06/29/2016  he wanted to watch diet and re check this  Declined medicine  Did not do well on the statins  ? Who in the family had high cholesterol   Pravastatin and atorvastatin gave him emotional side effects -depression and anx   occ eats sausage /bacon not often  Eats beef once per week  No fried foods  Tends to grill his meats  He has quit all of it and no improvement   Patient Active Problem List   Diagnosis Date Noted  . Essential hypertension 06/06/2016  . Elevated PSA 06/16/2011  . Routine general medical examination at a health care facility 06/16/2011  . Hyperglycemia 06/14/2011  . Prostate cancer  screening 06/14/2011  . Hyperlipidemia 09/08/2008  . ADENOCARCINOMA, RECTUM 03/08/2007  . NEPHROLITHIASIS, HX OF 03/08/2007   Past Medical History:  Diagnosis Date  . Diverticulosis   . Hyperlipidemia   . Nephrolithiasis   . rectal ca dx'd 2005   rectal   Past Surgical History:  Procedure Laterality Date  . enlarged prostate  08/2003   on CT scan  . GANGLION CYST EXCISION Right 05/2016  . LOW ANTERIOR BOWEL RESECTION     Social History  Substance Use Topics  . Smoking status: Never Smoker  . Smokeless tobacco: Never Used  . Alcohol use 2.4 oz/week    4 Glasses of wine per week     Comment: occ   Family History  Problem Relation Age of Onset  . Cancer Father 61       colon  . Colon cancer Father   . Cancer Brother        colon  . Colon cancer Brother    Allergies  Allergen Reactions  . Atorvastatin Other (See Comments)    REACTION: elevated transamines  . Pravastatin Sodium Other (See Comments)    REACTION: mood problems/irritability/personality change   Current Outpatient Prescriptions on File Prior to Visit  Medication Sig  Dispense Refill  . aspirin 81 MG tablet Take 81 mg by mouth daily.    Marland Kitchen ibuprofen (ADVIL,MOTRIN) 200 MG tablet Take 200 mg by mouth every 6 (six) hours as needed for pain.    . Multiple Vitamin (MULTIVITAMIN) tablet Take 1 tablet by mouth daily.     No current facility-administered medications on file prior to visit.      Review of Systems  Constitutional: Negative for activity change, appetite change, fatigue, fever and unexpected weight change.  HENT: Negative for congestion, rhinorrhea, sore throat and trouble swallowing.   Eyes: Negative for pain, redness, itching and visual disturbance.  Respiratory: Negative for cough, chest tightness, shortness of breath and wheezing.   Cardiovascular: Negative for chest pain and palpitations.  Gastrointestinal: Negative for abdominal pain, blood in stool, constipation, diarrhea and nausea.    Endocrine: Negative for cold intolerance, heat intolerance, polydipsia and polyuria.  Genitourinary: Negative for difficulty urinating, dysuria, frequency and urgency.  Musculoskeletal: Negative for arthralgias, joint swelling and myalgias.  Skin: Negative for pallor and rash.  Neurological: Negative for dizziness, tremors, weakness, numbness and headaches.  Hematological: Negative for adenopathy. Does not bruise/bleed easily.  Psychiatric/Behavioral: Negative for decreased concentration and dysphoric mood. The patient is not nervous/anxious.        Objective:   Physical Exam  Constitutional: He appears well-developed and well-nourished. No distress.  Well appearing   HENT:  Head: Normocephalic and atraumatic.  Mouth/Throat: Oropharynx is clear and moist.  Eyes: Pupils are equal, round, and reactive to light. Conjunctivae and EOM are normal.  Neck: Normal range of motion. Neck supple. No JVD present. Carotid bruit is not present. No thyromegaly present.  Cardiovascular: Normal rate, regular rhythm, normal heart sounds and intact distal pulses.  Exam reveals no gallop.   Pulmonary/Chest: Effort normal and breath sounds normal. No respiratory distress. He has no wheezes. He has no rales.  No crackles  Abdominal: Soft. Bowel sounds are normal. He exhibits no distension, no abdominal bruit and no mass. There is no tenderness.  Musculoskeletal: He exhibits no edema.  Lymphadenopathy:    He has no cervical adenopathy.  Neurological: He is alert. He has normal reflexes.  Skin: Skin is warm and dry. No rash noted.  Psychiatric: He has a normal mood and affect.          Assessment & Plan:   Problem List Items Addressed This Visit      Cardiovascular and Mediastinum   Essential hypertension - Primary    bp in fair control at this time at home but high here His cuff runs a little higher  bp at home are 130s/70s  BP Readings from Last 1 Encounters:  12/30/16 (!) 155/80   No  changes needed-will continue to manage with DASH eating and exercise  Continue to monitor at home Disc correct way to take bp  He will bring his monitor each visit as well to compare       Relevant Orders   Lipid panel     Other   Hyperglycemia    A1C today  Watching carbs Enc to cut back on sweets  disc imp of low glycemic diet and wt loss to prevent DM2       Relevant Orders   Hemoglobin A1c   Hyperlipidemia    High trig and LDL (225 last check) despite fair diet  Suspect hereditary  Intol of all the statins he has tried (they cause depressive symptoms and malaise)  ? Candidate for PCYK9  inhibitor  Info given to pt on this  Lab today- if not significantly imp with diet will ref to cardiology to disc above class and if a candidate /financial help      Relevant Orders   Lipid panel    Other Visit Diagnoses    Need for influenza vaccination       Relevant Orders   Flu Vaccine QUAD 6+ mos PF IM (Fluarix Quad PF) (Completed)

## 2016-12-30 NOTE — Patient Instructions (Addendum)
Avoid red meat/ fried foods/ egg yolks/ fatty breakfast meats/ butter, cheese and high fat dairy/ and shellfish   Let's check your cholesterol again  I think it is hereditary   We may want to send you to cardiology to discuss cholesterol control   Flu shot today  Labs today    Try to exercise 5 days per week for 30 minutes or more  Also drink lots of water

## 2016-12-30 NOTE — Assessment & Plan Note (Signed)
A1C today  Watching carbs Enc to cut back on sweets  disc imp of low glycemic diet and wt loss to prevent DM2

## 2016-12-30 NOTE — Assessment & Plan Note (Signed)
High trig and LDL (225 last check) despite fair diet  Suspect hereditary  Intol of all the statins he has tried (they cause depressive symptoms and malaise)  ? Candidate for PCYK9 inhibitor  Info given to pt on this  Lab today- if not significantly imp with diet will ref to cardiology to disc above class and if a candidate /financial help

## 2017-01-03 ENCOUNTER — Telehealth: Payer: Self-pay | Admitting: Family Medicine

## 2017-01-03 DIAGNOSIS — E782 Mixed hyperlipidemia: Secondary | ICD-10-CM

## 2017-01-03 NOTE — Telephone Encounter (Signed)
-----   Message from Tammi Sou, [place] sent at 01/03/2017  8:41 AM EST ----- Pt called back and per [facility] he does agree with referral to cardiology, please put referral in

## 2017-01-03 NOTE — Telephone Encounter (Signed)
Ref done  Will route to [facility] 

## 2017-01-27 ENCOUNTER — Encounter: Payer: Self-pay | Admitting: General Practice

## 2017-03-01 ENCOUNTER — Encounter: Payer: Self-pay | Admitting: Internal Medicine

## 2017-03-19 DIAGNOSIS — I700 Atherosclerosis of aorta: Secondary | ICD-10-CM | POA: Insufficient documentation

## 2017-03-19 DIAGNOSIS — I2510 Atherosclerotic heart disease of native coronary artery without angina pectoris: Secondary | ICD-10-CM | POA: Insufficient documentation

## 2017-03-19 NOTE — Progress Notes (Signed)
Cardiology Office Note  Date:  03/22/2017   ID:  [name], DOB 1950/07/19, MRN [id number]  PCP:  William Greenspan, MD   Chief Complaint  Patient presents with  . other    Hyperlipidemia. Meds reviewed verbally with pt.    HPI:  [name] is a 67 yo male with past medical history of Smoker, 20 yr Hyperlipidemia HTN Rectal cancer, s/p resection Pravastatin and atorvastatin intolerance with elevated LFTs per his report Who presents by referral from [name] for consultation of his hyperlipidemia  He reports that he smoked 20 years total Not excited about taking any medications for cholesterol or medications in general Checks his blood pressure at home typically 140/90 Does not really want to be on a blood pressure pill  Worked on his diet aggressively through the past year and did not see any dramatic change in his cholesterol still greater than 300 Of note he did have nice decline in his triglycerides 400 down to 200s  Discussed statin use in the past Tried pravastatin and Lipitor (elevated LFTS), gave him emotional side effects/depression and anx  Does not want to try a statin  He does have a history of Hyperglycemia Recent dietary changes hemoglobin A1c dropped 6.2 down to 6.0  Lab work reviewed with him LDL 234, Total chol 331  CT ABD: 03/2012:  Coronary athero CT chest 03/2012  Atherosclerotic calcifications of the abdominal aorta and branch Vessels.  Images above pulled up in detail in the office and reviewed No significant coronary calcification noted No significant plaque in the aortic arch, ascending aorta, proximal descending aorta Mild plaque in the distal descending aorta There is minimal coronary calcification three-vessel  Denies any chest pain or shortness of breath on exertion  EKG personally reviewed by myself on todays visit Shows normal sinus rhythm rate 77 bpm no significant ST or T wave changes   PMH:   has a past medical history of  Diverticulosis, Hyperlipidemia, Nephrolithiasis, and rectal ca (dx'd 2005).  PSH:    Past Surgical History:  Procedure Laterality Date  . enlarged prostate  08/2003   on CT scan  . GANGLION CYST EXCISION Right 05/2016  . LOW ANTERIOR BOWEL RESECTION      Current Outpatient Medications  Medication Sig Dispense Refill  . aspirin 81 MG tablet Take 81 mg by mouth daily.    Marland Kitchen ibuprofen (ADVIL,MOTRIN) 200 MG tablet Take 200 mg by mouth every 6 (six) hours as needed for pain.    . Multiple Vitamin (MULTIVITAMIN) tablet Take 1 tablet by mouth daily.     No current facility-administered medications for this visit.      Allergies:   Atorvastatin and Pravastatin sodium   Social History:  The patient  reports that  has never smoked. he has never used smokeless tobacco. He reports that he drinks about 2.4 oz of alcohol per week. He reports that he does not use drugs.   Family History:   family history includes Cancer in his brother; Cancer (age of onset: 35) in his father; Colon cancer in his brother and father.    Review of Systems: Review of Systems  Constitutional: Negative.   Respiratory: Negative.   Cardiovascular: Negative.   Gastrointestinal: Negative.   Musculoskeletal: Negative.   Neurological: Negative.   Psychiatric/Behavioral: Negative.   All other systems reviewed and are negative.    PHYSICAL EXAM: VS:  Ht 6' (1.829 m)   Wt 200 lb 4 oz (90.8 kg)   BMI 27.16  kg/m  , BMI Body mass index is 27.16 kg/m. GEN: Well nourished, well developed, in no acute distress  HEENT: normal  Neck: no JVD, carotid bruits, or masses Cardiac: RRR; no murmurs, rubs, or gallops,no edema  Respiratory:  clear to auscultation bilaterally, normal work of breathing GI: soft, nontender, nondistended, + BS MS: no deformity or atrophy  Skin: warm and dry, no rash Neuro:  Strength and sensation are intact Psych: euthymic mood, full affect    Recent Labs: 06/06/2016: TSH 1.90 11/15/2016: ALT  30; BUN 15; Creatinine, Ser 1.05; Hemoglobin 16.1; Platelets 254; Potassium 3.8; Sodium 139    Lipid Panel Lab Results  Component Value Date   CHOL 331 (H) 12/30/2016   HDL 48.00 12/30/2016   TRIG 277.0 (H) 12/30/2016      Wt Readings from Last 3 Encounters:  03/22/17 200 lb 4 oz (90.8 kg)  12/30/16 190 lb (86.2 kg)  11/15/16 190 lb (86.2 kg)       ASSESSMENT AND PLAN:  Essential hypertension - Plan: EKG 12-Lead Long discussion concerning blood pressure which is running high on today's visit 140/90 at home on average but reports his blood pressure cuff runs high Recommend he continue to monitor blood pressure and if this continues to run at its current range or higher that he call our office to start on medications Is not particularly eager to start on blood pressure pill or other pills in general Recommend he try to keep his weight low  Coronary artery calcification seen on CT scan - Plan: EKG 12-Lead Minimal plaque noted, images reviewed with him.  Images from 2014 No indication for stress testing  Aortic atherosclerosis ([place]) - Plan: EKG 12-Lead Mild plaque noted in the distal descending aorta in 2014  Mixed hyperlipidemia - Plan: EKG 12-Lead Numbers high, reviewed with him We did suggest Zetia to start, he is not interested in pills We did talk about the PCSK9 inhibitors We did offer to run this past pharmacy to see if he would qualify based on likely familial hyperlipidemia.  He will call us if he is interested, not particularly interested at this time Feels reassured given CT scan findings as above as plaque is not significant   Elevated glucose Strongly recommended he keep his weight low Recent efforts have resulted in improved hemoglobin A1c   Disposition:   F/U as needed   Total encounter time more than 60 minutes  Greater than 50% was spent in counseling and coordination of care with the patient    Orders Placed This Encounter  Procedures  . EKG  12-Lead     Signed, Esmond Plants, M.D., Ph.D. 03/22/2017  [place], [place]

## 2017-03-21 ENCOUNTER — Ambulatory Visit: Payer: Medicare Other | Admitting: Cardiovascular Disease

## 2017-03-22 ENCOUNTER — Encounter: Payer: Self-pay | Admitting: Cardiovascular Disease

## 2017-03-22 ENCOUNTER — Ambulatory Visit (INDEPENDENT_AMBULATORY_CARE_PROVIDER_SITE_OTHER): Payer: Medicare Other | Admitting: Cardiovascular Disease

## 2017-03-22 VITALS — BP 142/90 | HR 77 | Ht 72.0 in | Wt 200.2 lb

## 2017-03-22 DIAGNOSIS — I10 Essential (primary) hypertension: Secondary | ICD-10-CM

## 2017-03-22 DIAGNOSIS — I700 Atherosclerosis of aorta: Secondary | ICD-10-CM | POA: Diagnosis not present

## 2017-03-22 DIAGNOSIS — I2510 Atherosclerotic heart disease of native coronary artery without angina pectoris: Secondary | ICD-10-CM | POA: Diagnosis not present

## 2017-03-22 DIAGNOSIS — E782 Mixed hyperlipidemia: Secondary | ICD-10-CM

## 2017-03-22 NOTE — Patient Instructions (Addendum)
Medication Instructions:   Monitor blood pressure Call if numbers run high (>140 all the time/90s)  Research zetia, for cholesterol  Injection: praluent/repatha  Labwork:  No new labs needed  Testing/Procedures:  No further testing at this time  Call if you would ever like a CT coronary calcium score $150   Follow-Up: It was a pleasure seeing you in the office today. Please call us if you have new issues that need to be addressed before your next appt.  [phone]  Your physician wants you to follow-up in:  As needed  If you need a refill on your cardiac medications before your next appointment, please call your pharmacy.

## 2017-03-24 ENCOUNTER — Ambulatory Visit: Payer: Medicare Other | Admitting: Cardiovascular Disease

## 2017-03-29 ENCOUNTER — Encounter: Payer: Self-pay | Admitting: Internal Medicine

## 2017-03-29 ENCOUNTER — Other Ambulatory Visit: Payer: Self-pay

## 2017-03-29 ENCOUNTER — Ambulatory Visit (AMBULATORY_SURGERY_CENTER): Payer: Self-pay | Admitting: *Deleted

## 2017-03-29 VITALS — Ht 72.0 in | Wt 197.0 lb

## 2017-03-29 DIAGNOSIS — Z85048 Personal history of other malignant neoplasm of rectum, rectosigmoid junction, and anus: Secondary | ICD-10-CM

## 2017-03-29 DIAGNOSIS — Z800 Family history of malignant neoplasm of digestive organs: Secondary | ICD-10-CM

## 2017-03-29 MED ORDER — NA SULFATE-K SULFATE-MG SULF 17.5-3.13-1.6 GM/177ML PO SOLN: 1.0000 [IU] | Freq: Once | ORAL | 0 refills | Status: AC

## 2017-03-29 NOTE — Progress Notes (Signed)
No egg or soy allergy known to patient  No issues with past sedation with any surgeries  or procedures, no intubation problems  No diet pills per patient No home 02 use per patient  No blood thinners per patient  Pt denies issues with constipation  No A fib or A flutter  EMMI video sent to pt's e mail pt declined   

## 2017-04-12 ENCOUNTER — Ambulatory Visit (AMBULATORY_SURGERY_CENTER): Payer: Medicare Other | Admitting: Internal Medicine

## 2017-04-12 ENCOUNTER — Other Ambulatory Visit: Payer: Self-pay

## 2017-04-12 ENCOUNTER — Encounter: Payer: Self-pay | Admitting: Internal Medicine

## 2017-04-12 VITALS — BP 141/80 | HR 58 | Temp 97.3°F | Resp 17 | Ht 72.0 in | Wt 200.0 lb

## 2017-04-12 DIAGNOSIS — D125 Benign neoplasm of sigmoid colon: Secondary | ICD-10-CM | POA: Diagnosis not present

## 2017-04-12 DIAGNOSIS — K635 Polyp of colon: Secondary | ICD-10-CM

## 2017-04-12 DIAGNOSIS — D122 Benign neoplasm of ascending colon: Secondary | ICD-10-CM | POA: Diagnosis not present

## 2017-04-12 DIAGNOSIS — Z85048 Personal history of other malignant neoplasm of rectum, rectosigmoid junction, and anus: Secondary | ICD-10-CM | POA: Diagnosis present

## 2017-04-12 MED ORDER — SODIUM CHLORIDE 0.9 % IV SOLN
500.0000 mL | Freq: Once | INTRAVENOUS | Status: DC
Start: 2017-04-12 — End: 2020-11-23

## 2017-04-12 NOTE — Op Note (Signed)
[facility] Patient Name: [name] Procedure Date: 04/12/2017 7:53 AM MRN: [id number] Endoscopist: Docia Chuck. Henrene Pastor , MD Age: 67 Referring MD:  Date of Birth: Mar 03, 1950 Gender: Male Account #: [id number] Procedure:                Colonoscopy, With cold snare polypectomy -3 Indications:              High risk colon cancer surveillance: Personal                            history of colon cancer. Status post LAR 2005.                            Subsequent colonoscopies 2006, 2007, 2010, 2013 Medicines:                Monitored Anesthesia Care Procedure:                Pre-Anesthesia Assessment:                           - Prior to the procedure, a History and Physical                            was performed, and patient medications and                            allergies were reviewed. The patient's tolerance of                            previous anesthesia was also reviewed. The risks                            and benefits of the procedure and the sedation                            options and risks were discussed with the patient.                            All questions were answered, and informed consent                            was obtained. Prior Anticoagulants: The patient has                            taken no previous anticoagulant or antiplatelet                            agents. ASA Grade Assessment: I - A normal, healthy                            patient. After reviewing the risks and benefits,                            the patient was deemed in satisfactory condition to  undergo the procedure.                           After obtaining informed consent, the colonoscope                            was passed under direct vision. Throughout the                            procedure, the patient's blood pressure, pulse, and                            oxygen saturations were monitored continuously. The   Colonoscope was introduced through the anus and                            advanced to the the cecum, identified by                            appendiceal orifice and ileocecal valve. The                            ileocecal valve, appendiceal orifice, and rectum                            were photographed. The quality of the bowel                            preparation was excellent. The colonoscopy was                            performed without difficulty. The patient tolerated                            the procedure well. The bowel preparation used was                            SUPREP. Scope In: 8:23:27 AM Scope Out: 8:36:03 AM Scope Withdrawal Time: 0 hours 10 minutes 45 seconds  Total Procedure Duration: 0 hours 12 minutes 36 seconds  Findings:                 Three polyps were found in the sigmoid colon and                            ascending colon. The polyps were 2 to 3 mm in size.                            These polyps were removed with a cold snare.                            Resection and retrieval were complete.                           Multiple medium-mouthed diverticula were found in  the transverse colon and left colon.                           Internal hemorrhoids were found during retroflexion.                           Healthy anastomosis in the mid rectum. The exam was                            otherwise without abnormality on direct and                            retroflexion views. Complications:            No immediate complications. Estimated blood loss:                            None. Estimated Blood Loss:     Estimated blood loss: none. Impression:               - Three 2 to 3 mm polyps in the sigmoid colon and                            in the ascending colon, removed with a cold snare.                            Resected and retrieved.                           - Diverticulosis in the transverse colon and in the                             left colon.                           - Internal hemorrhoids.                           - The examination was otherwise normal on direct                            and retroflexion views, Status post LAR. Recommendation:           - Repeat colonoscopy in 5 years for surveillance.                           - Patient has a contact number available for                            emergencies. The signs and symptoms of potential                            delayed complications were discussed with the                            patient. Return to normal activities tomorrow.  Written discharge instructions were provided to the                            patient.                           - Resume previous diet.                           - Continue present medications.                           - Await pathology results. Docia Chuck. Henrene Pastor, MD 04/12/2017 8:47:28 AM This report has been signed electronically.

## 2017-04-12 NOTE — Patient Instructions (Signed)
YOU HAD AN ENDOSCOPIC PROCEDURE TODAY AT [facility]:   Refer to the procedure report that was given to you for any specific questions about what was found during the examination.  If the procedure report does not answer your questions, please call your gastroenterologist to clarify.  If you requested that your care partner not be given the details of your procedure findings, then the procedure report has been included in a sealed envelope for you to review at your convenience later.  YOU SHOULD EXPECT: Some feelings of bloating in the abdomen. Passage of more gas than usual.  Walking can help get rid of the air that was put into your GI tract during the procedure and reduce the bloating. If you had a lower endoscopy (such as a colonoscopy or flexible sigmoidoscopy) you may notice spotting of blood in your stool or on the toilet paper. If you underwent a bowel prep for your procedure, you may not have a normal bowel movement for a few days.  Please Note:  You might notice some irritation and congestion in your nose or some drainage.  This is from the oxygen used during your procedure.  There is no need for concern and it should clear up in a day or so.  SYMPTOMS TO REPORT IMMEDIATELY:   Following lower endoscopy (colonoscopy or flexible sigmoidoscopy):  Excessive amounts of blood in the stool  Significant tenderness or worsening of abdominal pains  Swelling of the abdomen that is new, acute  Fever of 100F or higher  Please read all handouts given to you on Polyps, Diverticulosis and Hemorrhoids. For urgent or emergent issues, a gastroenterologist can be reached at any hour by calling [phone].   DIET:  We do recommend a small meal at first, but then you may proceed to your regular diet.  Drink plenty of fluids but you should avoid alcoholic beverages for 24 hours.  ACTIVITY:  You should plan to take it easy for the rest of today and you should NOT DRIVE or use heavy  machinery until tomorrow (because of the sedation medicines used during the test).    FOLLOW UP: Our staff will call the number listed on your records the next business day following your procedure to check on you and address any questions or concerns that you may have regarding the information given to you following your procedure. If we do not reach you, we will leave a message.  However, if you are feeling well and you are not experiencing any problems, there is no need to return our call.  We will assume that you have returned to your regular daily activities without incident.  If any biopsies were taken you will be contacted by phone or by letter within the next 1-3 weeks.  Please call us at [phone] if you have not heard about the biopsies in 3 weeks.    SIGNATURES/CONFIDENTIALITY: You and/or your care partner have signed paperwork which will be entered into your electronic medical record.  These signatures attest to the fact that that the information above on your After Visit Summary has been reviewed and is understood.  Full responsibility of the confidentiality of this discharge information lies with you and/or your care-partner.  Thank you for letting us take care of your healthcare needs today.

## 2017-04-12 NOTE — Progress Notes (Signed)
To recovery, report to RN, VSS. 

## 2017-04-12 NOTE — Progress Notes (Signed)
Called to room to assist during endoscopic procedure.  Patient ID and intended procedure confirmed with present staff. Received instructions for my participation in the procedure from the performing physician.  

## 2017-04-12 NOTE — Progress Notes (Signed)
No changes in medical or surgical hx since PV per pt 

## 2017-04-13 ENCOUNTER — Telehealth: Payer: Self-pay | Admitting: *Deleted

## 2017-04-13 NOTE — Telephone Encounter (Signed)
  Follow up Call-  Call back number 04/12/2017  Post procedure Call Back phone  # [phone]  Permission to leave phone message Yes  Some recent data might be hidden     Patient questions:  Do you have a fever, pain , or abdominal swelling? No. Pain Score  0 *  Have you tolerated food without any problems? Yes.    Have you been able to return to your normal activities? Yes.    Do you have any questions about your discharge instructions: Diet   No. Medications  No. Follow up visit  No.  Do you have questions or concerns about your Care? No.  Actions: * If pain score is 4 or above: No action needed, pain <4.

## 2017-04-17 ENCOUNTER — Encounter: Payer: Self-pay | Admitting: Internal Medicine

## 2017-06-30 ENCOUNTER — Ambulatory Visit: Payer: Medicare Other | Admitting: Family Medicine

## 2017-07-03 ENCOUNTER — Ambulatory Visit: Payer: Medicare Other

## 2017-07-03 ENCOUNTER — Ambulatory Visit (INDEPENDENT_AMBULATORY_CARE_PROVIDER_SITE_OTHER): Payer: Medicare Other

## 2017-07-03 ENCOUNTER — Ambulatory Visit (INDEPENDENT_AMBULATORY_CARE_PROVIDER_SITE_OTHER): Payer: Medicare Other | Admitting: Family Medicine

## 2017-07-03 ENCOUNTER — Encounter: Payer: Self-pay | Admitting: Family Medicine

## 2017-07-03 VITALS — BP 134/82 | HR 86 | Temp 97.5°F | Ht 71.75 in | Wt 191.8 lb

## 2017-07-03 DIAGNOSIS — I10 Essential (primary) hypertension: Secondary | ICD-10-CM

## 2017-07-03 DIAGNOSIS — Z23 Encounter for immunization: Secondary | ICD-10-CM

## 2017-07-03 DIAGNOSIS — E782 Mixed hyperlipidemia: Secondary | ICD-10-CM

## 2017-07-03 DIAGNOSIS — Z0000 Encounter for general adult medical examination without abnormal findings: Secondary | ICD-10-CM

## 2017-07-03 DIAGNOSIS — R972 Elevated prostate specific antigen [PSA]: Secondary | ICD-10-CM | POA: Diagnosis not present

## 2017-07-03 DIAGNOSIS — C20 Malignant neoplasm of rectum: Secondary | ICD-10-CM | POA: Diagnosis not present

## 2017-07-03 DIAGNOSIS — R739 Hyperglycemia, unspecified: Secondary | ICD-10-CM

## 2017-07-03 LAB — COMPREHENSIVE METABOLIC PANEL
ALK PHOS: 90 U/L (ref 39–117)
ALT: 25 U/L (ref 0–53)
AST: 18 U/L (ref 0–37)
Albumin: 4.2 g/dL (ref 3.5–5.2)
BUN: 16 mg/dL (ref 6–23)
CALCIUM: 9.8 mg/dL (ref 8.4–10.5)
CO2: 32 mEq/L (ref 19–32)
CREATININE: 1.03 mg/dL (ref 0.40–1.50)
Chloride: 103 mEq/L (ref 96–112)
GFR: 76.39 mL/min (ref >60.00)
Glucose, Bld: 108 mg/dL — ABNORMAL HIGH (ref 70–99)
Potassium: 4.4 mEq/L (ref 3.5–5.1)
Sodium: 143 mEq/L (ref 135–145)
Total Bilirubin: 0.4 mg/dL (ref 0.2–1.2)
Total Protein: 7.7 g/dL (ref 6.0–8.3)

## 2017-07-03 LAB — CBC WITH DIFFERENTIAL/PLATELET
BASOS ABS: 0.1 10*3/uL (ref 0.0–0.1)
Basophils Relative: 1.1 % (ref 0.0–3.0)
EOS ABS: 0.2 10*3/uL (ref 0.0–0.7)
Eosinophils Relative: 2.9 % (ref 0.0–5.0)
HEMATOCRIT: 46.4 % (ref 39.0–52.0)
Hemoglobin: 15.8 g/dL (ref 13.0–17.0)
LYMPHS PCT: 30.9 % (ref 12.0–46.0)
Lymphs Abs: 1.9 10*3/uL (ref 0.7–4.0)
MCHC: 34.1 g/dL (ref 30.0–36.0)
MCV: 90.8 fl (ref 78.0–100.0)
Monocytes Absolute: 0.5 10*3/uL (ref 0.1–1.0)
Monocytes Relative: 7.6 % (ref 3.0–12.0)
NEUTROS ABS: 3.5 10*3/uL (ref 1.4–7.7)
NEUTROS PCT: 57.5 % (ref 43.0–77.0)
PLATELETS: 242 10*3/uL (ref 150.0–400.0)
RBC: 5.11 Mil/uL (ref 4.22–5.81)
RDW: 13.4 % (ref 11.5–15.5)
WBC: 6.1 10*3/uL (ref 4.0–10.5)

## 2017-07-03 LAB — LIPID PANEL
CHOL/HDL RATIO: 6
CHOLESTEROL: 320 mg/dL — AB (ref 0–200)
HDL: 49.9 mg/dL (ref >39.00)
NonHDL: 270.3
Triglycerides: 309 mg/dL — ABNORMAL HIGH (ref 0.0–149.0)
VLDL: 61.8 mg/dL — ABNORMAL HIGH (ref 0.0–40.0)

## 2017-07-03 LAB — TSH: TSH: 1.48 u[IU]/mL (ref 0.35–4.50)

## 2017-07-03 LAB — HEMOGLOBIN A1C: Hgb A1c MFr Bld: 6.1 % (ref 4.6–6.5)

## 2017-07-03 LAB — LDL CHOLESTEROL, DIRECT: LDL DIRECT: 216 mg/dL

## 2017-07-03 NOTE — Progress Notes (Signed)
PCP notes:   Health maintenance:  No gaps identified.  Abnormal screenings:   Hearing - failed  Hearing Screening   125Hz  250Hz  500Hz  1000Hz  2000Hz  3000Hz  4000Hz  6000Hz  8000Hz   Right ear:   40 40 40  40    Left ear:   40 40 40  0    Comments: Tinnitus in both ears  Patient concerns:   None  Nurse concerns:  None  Next PCP appt:   N/A; CPE prior to AWV  I reviewed health advisor's note, was available for consultation, and agree with documentation and plan. Loura Pardon MD

## 2017-07-03 NOTE — Assessment & Plan Note (Signed)
Doing well/cancer free currently  Keeping up with 5 y colonoscopies

## 2017-07-03 NOTE — Progress Notes (Signed)
Subjective:   [name] is a 67 y.o. male who presents for Medicare Annual/Subsequent preventive examination.  Review of Systems:  N/A Cardiac Risk Factors include: advanced age (>66men, >39 women);male gender;hypertension     Objective:    Vitals: BP 134/82 (BP Location: Right Arm, Patient Position: Sitting, Cuff Size: Normal)   Pulse 86   Temp (!) 97.5 F (36.4 C) (Oral)   Ht 5' 11.75" (1.822 m)   Wt 191 lb 12 oz (87 kg)   SpO2 95%   BMI 26.19 kg/m   Body mass index is 26.19 kg/m.  Advanced Directives 07/03/2017 04/12/2017 11/15/2016 06/29/2016  Does Patient Have a Medical Advance Directive? Yes Yes No Yes  Type of Paramedic of [place];Living will Healthcare Power of [place];Living will  Copy of [place] in Chart? No - copy requested - - No - copy requested    Tobacco Social History   Tobacco Use  Smoking Status Former Smoker  . Types: Cigarettes  Smokeless Tobacco Never Used  Tobacco Comment   quit 23+years ago     Counseling given: No Comment: quit 23+years ago   Clinical Intake:  Pre-visit preparation completed: Yes  Pain : No/denies pain Pain Score: 0-No pain     Nutritional Risks: None Diabetes: No  How often do you need to have someone help you when you read instructions, pamphlets, or other written materials from your doctor or pharmacy?: 1 - Never What is the last grade level you completed in school?: Associate degree  Interpreter Needed?: No  Comments: pt lives with spouse Information entered by :: LPinson, LPN  Past Medical History:  Diagnosis Date  . Diverticulosis   . Hyperlipidemia   . Hypertension    borderline not on meds at this time  . Nephrolithiasis   . rectal ca dx'd 2005   rectal   Past Surgical History:  Procedure Laterality Date  . COLONOSCOPY    . enlarged prostate  08/2003   on CT scan  . GANGLION CYST EXCISION Right 05/2016  . LOW  ANTERIOR BOWEL RESECTION     Family History  Problem Relation Age of Onset  . Cancer Father 14       colon  . Colon cancer Father   . Colon polyps Father   . Cancer Brother        colon  . Colon cancer Brother   . Colon polyps Brother   . Esophageal cancer Neg Hx   . Rectal cancer Neg Hx   . Stomach cancer Neg Hx    Social History   Socioeconomic History  . Marital status: Married    Spouse name: Not on file  . Number of children: Not on file  . Years of education: Not on file  . Highest education level: Not on file  Occupational History  . Occupation: Engineer, maintenance: [employer]  . Financial resource strain: Not on file  . Food insecurity:    Worry: Not on file    Inability: Not on file  . Transportation needs:    Medical: Not on file    Non-medical: Not on file  Tobacco Use  . Smoking status: Former Smoker    Types: Cigarettes  . Smokeless tobacco: Never Used  . Tobacco comment: quit 23+years ago  Substance and Sexual Activity  . Alcohol use: Yes    Alcohol/week: 0.6 oz    Types: 1  Glasses of wine per week    Comment: daily  . Drug use: No  . Sexual activity: Not Currently  Lifestyle  . Physical activity:    Days per week: Not on file    Minutes per session: Not on file  . Stress: Not on file  Relationships  . Social connections:    Talks on phone: Not on file    Gets together: Not on file    Attends religious service: Not on file    Active member of club or organization: Not on file    Attends meetings of clubs or organizations: Not on file    Relationship status: Not on file  Other Topics Concern  . Not on file  Social History Narrative  . Not on file    Outpatient Encounter Medications as of 07/03/2017  Medication Sig  . ibuprofen (ADVIL,MOTRIN) 200 MG tablet Take 200 mg by mouth every 6 (six) hours as needed for pain.  . Multiple Vitamin (MULTIVITAMIN) tablet Take 1 tablet by mouth daily.  . [DISCONTINUED] aspirin  81 MG tablet Take 81 mg by mouth daily.   Facility-Administered Encounter Medications as of 07/03/2017  Medication  . 0.9 %  sodium chloride infusion    Activities of Daily Living In your present state of health, do you have any difficulty performing the following activities: 07/03/2017  Hearing? Y  Vision? N  Difficulty concentrating or making decisions? N  Walking or climbing stairs? N  Dressing or bathing? N  Doing errands, shopping? N  Preparing Food and eating ? N  Using the Toilet? N  In the past six months, have you accidently leaked urine? N  Do you have problems with loss of bowel control? N  Managing your Medications? N  Managing your Finances? N  Housekeeping or managing your Housekeeping? N  Some recent data might be hidden    Patient Care Team: Tower, Wynelle Fanny, MD as PCP - General   Assessment:   This is a routine wellness examination for [name].   Hearing Screening   125Hz  250Hz  500Hz  1000Hz  2000Hz  3000Hz  4000Hz  6000Hz  8000Hz   Right ear:   40 40 40  40    Left ear:   40 40 40  0    Comments: Tinnitus in both ears   Visual Acuity Screening   Right eye Left eye Both eyes  Without correction: 20/20 20/20 20/20   With correction:        Exercise Activities and Dietary recommendations Current Exercise Habits: Home exercise routine, Type of exercise: walking;Other - see comments;calisthenics(golfing once weekly, ), Time (Minutes): 20, Frequency (Times/Week): 7, Weekly Exercise (Minutes/Week): 140, Intensity: Moderate, Exercise limited by: None identified  Goals    . DIET - INCREASE WATER INTAKE     Starting 07/03/2017, I will attempt to drink at least 6-8 glasses of water daily.        Fall Risk Fall Risk  07/03/2017 06/29/2016  Falls in the past year? No No   Depression Screen PHQ 2/9 Scores 07/03/2017 06/29/2016  PHQ - 2 Score 0 0  PHQ- 9 Score 0 -    Cognitive Function MMSE - Mini Mental State Exam 07/03/2017 06/29/2016  Orientation to time 5 5  Orientation  to Place 5 5  Registration 3 3  Attention/ Calculation 0 0  Recall 3 3  Language- name 2 objects 0 0  Language- repeat 1 1  Language- follow 3 step command 3 3  Language- read & follow direction 0 0  Write a sentence 0 0  Copy design 0 0  Total score 20 20     PLEASE NOTE: A Mini-Cog screen was completed. Maximum score is 20. A value of 0 denotes this part of [place] MMSE was not completed or the patient failed this part of the Mini-Cog screening.   Mini-Cog Screening Orientation to Time - Max 5 pts Orientation to Place - Max 5 pts Registration - Max 3 pts Recall - Max 3 pts Language Repeat - Max 1 pts Language Follow 3 Step Command - Max 3 pts     Immunization History  Administered Date(s) Administered  . Influenza,inj,Quad PF,6+ Mos 12/30/2016  . Pneumococcal Conjugate-13 06/06/2016  . Pneumococcal Polysaccharide-23 07/03/2017  . Td 07/07/2003, 07/12/2016    Screening Tests Health Maintenance  Topic Date Due  . INFLUENZA VACCINE  09/28/2017  . COLONOSCOPY  04/12/2022  . TETANUS/TDAP  07/13/2026  . Hepatitis C Screening  Completed  . PNA vac Low Risk Adult  Completed        Plan:      I have personally reviewed, addressed, and noted the following in the patient's chart:  A. Medical and social history B. Use of alcohol, tobacco or illicit drugs  C. Current medications and supplements D. Functional ability and status E.  Nutritional status F.  Physical activity G. Advance directives H. List of other physicians I.  Hospitalizations, surgeries, and ER visits in previous 12 months J.  [place] to include hearing, vision, cognitive, depression L. Referrals and appointments - none  In addition, I have reviewed and discussed with patient certain preventive protocols, quality metrics, and best practice recommendations. A written personalized care plan for preventive services as well as general preventive health recommendations were provided to  patient.  See attached scanned questionnaire for additional information.   Signed,   Lindell Noe, MHA, BS, LPN Health Coach

## 2017-07-03 NOTE — Patient Instructions (Signed)
Mr. [name] , Thank you for taking time to come for your Medicare Wellness Visit. I appreciate your ongoing commitment to your health goals. Please review the following plan we discussed and let me know if I can assist you in the future.   These are the goals we discussed: Goals    . DIET - INCREASE WATER INTAKE     Starting 07/03/2017, I will attempt to drink at least 6-8 glasses of water daily.        This is a list of the screening recommended for you and due dates:  Health Maintenance  Topic Date Due  . Flu Shot  09/28/2017  . Colon Cancer Screening  04/12/2022  . Tetanus Vaccine  07/13/2026  .  Hepatitis C: One time screening is recommended by Center for Disease Control  (CDC) for  adults born from [birth year] through 1966.   Completed  . Pneumonia vaccines  Completed   Preventive Care for Adults  A healthy lifestyle and preventive care can promote health and wellness. Preventive health guidelines for adults include the following key practices.  . A routine yearly physical is a good way to check with your health care provider about your health and preventive screening. It is a chance to share any concerns and updates on your health and to receive a thorough exam.  . Visit your dentist for a routine exam and preventive care every 6 months. Brush your teeth twice a day and floss once a day. Good oral hygiene prevents tooth decay and gum disease.  . The frequency of eye exams is based on your age, health, family medical history, use  of contact lenses, and other factors. Follow your health care provider's recommendations for frequency of eye exams.  . Eat a healthy diet. Foods like vegetables, fruits, whole grains, low-fat dairy products, and lean protein foods contain the nutrients you need without too many calories. Decrease your intake of foods high in solid fats, added sugars, and salt. Eat the right amount of calories for you. Get information about a proper diet from your health care  provider, if necessary.  . Regular physical exercise is one of the most important things you can do for your health. Most adults should get at least 150 minutes of moderate-intensity exercise (any activity that increases your heart rate and causes you to sweat) each week. In addition, most adults need muscle-strengthening exercises on 2 or more days a week.  Silver Sneakers may be a benefit available to you. To determine eligibility, you may visit the website: www.silversneakers.com or contact program at (469) 440-6870 Mon-Fri between 8AM-8PM.   . Maintain a healthy weight. The body mass index (BMI) is a screening tool to identify possible weight problems. It provides an estimate of body fat based on height and weight. Your health care provider can find your BMI and can help you achieve or maintain a healthy weight.   For adults 20 years and older: ? A BMI below 18.5 is considered underweight. ? A BMI of 18.5 to 24.9 is normal. ? A BMI of 25 to 29.9 is considered overweight. ? A BMI of 30 and above is considered obese.   . Maintain normal blood lipids and cholesterol levels by exercising and minimizing your intake of saturated fat. Eat a balanced diet with plenty of fruit and vegetables. Blood tests for lipids and cholesterol should begin at age 63 and be repeated every 5 years. If your lipid or cholesterol levels are high, you are over 50,  or you are at high risk for heart disease, you may need your cholesterol levels checked more frequently. Ongoing high lipid and cholesterol levels should be treated with medicines if diet and exercise are not working.  . If you smoke, find out from your health care provider how to quit. If you do not use tobacco, please do not start.  . If you choose to drink alcohol, please do not consume more than 2 drinks per day. One drink is considered to be 12 ounces (355 mL) of beer, 5 ounces (148 mL) of wine, or 1.5 ounces (44 mL) of liquor.  . If you are 29-79 years  old, ask your health care provider if you should take aspirin to prevent strokes.  . Use sunscreen. Apply sunscreen liberally and repeatedly throughout the day. You should seek shade when your shadow is shorter than you. Protect yourself by wearing long sleeves, pants, a wide-brimmed hat, and sunglasses year round, whenever you are outdoors.  . Once a month, do a whole body skin exam, using a mirror to look at the skin on your back. Tell your health care provider of new moles, moles that have irregular borders, moles that are larger than a pencil eraser, or moles that have changed in shape or color.

## 2017-07-03 NOTE — Assessment & Plan Note (Signed)
bp is controlled with lifestyle change bp in fair control at this time  BP Readings from Last 1 Encounters:  07/03/17 134/82   No changes needed Disc lifstyle change with low sodium diet and exercise

## 2017-07-03 NOTE — Assessment & Plan Note (Signed)
Lipid panel today  Very high LDL in the past - he is intol of statins  Saw cardiology-had a good CA index on CT and it was not advised that he take PCYK9 inhibitor based on that   Disc goals for lipids and reasons to control them Rev last labs with pt Rev low sat fat diet in detail - he is doing well with that

## 2017-07-03 NOTE — Patient Instructions (Addendum)
If you are interested in the new shingles vaccine (Shingrix) - call your local pharmacy to check on coverage and availability  If you can afford it get on a waiting list    Continue following up with urology for prostate care   For diabetes prevention  Try to get most of your carbohydrates from produce (with the exception of white potatoes)  Eat less bread/pasta/rice/snack foods/cereals/sweets and other items from the middle of the grocery store (processed carbs)

## 2017-07-03 NOTE — Progress Notes (Signed)
Subjective:    Patient ID: [name], male    DOB: Jan 31, 1951, 67 y.o.   MRN: [id number]  HPI Here for health maintenance exam and to review chronic medical problems    Feeling fine  Nothing new   Wt Readings from Last 3 Encounters:  07/03/17 191 lb 12 oz (87 kg)  04/12/17 200 lb (90.7 kg)  03/29/17 197 lb (89.4 kg)  he does not think weight is changed (188 at home) He eats healthy  Plays golf for exercise  26.19 kg/m   Will have amw today after this visit   Due for PPSV23  Tetanus shot - had at [facility] after last appt   Zoster status -has not had /has not had vaccine   bp BP Readings from Last 3 Encounters:  07/03/17 134/82  04/12/17 (!) 141/80  03/22/17 (!) 142/90  the usual for him    Colonoscopy 2/19 with 5 y recall  Hx of rectal cancer   Hyperlipidemia Lab Results  Component Value Date   CHOL 331 (H) 12/30/2016   HDL 48.00 12/30/2016   LDLDIRECT 234.0 12/30/2016   TRIG 277.0 (H) 12/30/2016   CHOLHDL 7 12/30/2016   Cannot take statins He had Ct calcium score - per pt Dr Rockey Situ said he did not need PCYK9 due to low calcium score Eats healthy   Hyperglycemia Lab Results  Component Value Date   HGBA1C 6.0 12/30/2016  up from 5.9 Prediabetic  Has a sweet tooth - does not watch his diet     Hx of elevated psa  Lab Results  Component Value Date   PSA 5.87 (H) 06/06/2016   PSA 3.57 06/14/2011   PSA 1.89 09/08/2008   due for labs  Urology - last psa went down  6 mo and 12 mo follow up planned with them  Has slower urine stream than he used to  No nocturia   Patient Active Problem List   Diagnosis Date Noted  . Coronary artery calcification seen on CT scan 03/19/2017  . Aortic atherosclerosis ([address]) 03/19/2017  . Essential hypertension 06/06/2016  . Elevated PSA 06/16/2011  . Routine general medical examination at a health care facility 06/16/2011  . Hyperglycemia 06/14/2011  . Prostate cancer screening 06/14/2011  . Hyperlipidemia  09/08/2008  . ADENOCARCINOMA, RECTUM 03/08/2007  . NEPHROLITHIASIS, HX OF 03/08/2007   Past Medical History:  Diagnosis Date  . Diverticulosis   . Hyperlipidemia   . Hypertension    borderline not on meds at this time  . Nephrolithiasis   . rectal ca dx'd 2005   rectal   Past Surgical History:  Procedure Laterality Date  . COLONOSCOPY    . enlarged prostate  08/2003   on CT scan  . GANGLION CYST EXCISION Right 05/2016  . LOW ANTERIOR BOWEL RESECTION     Social History   Tobacco Use  . Smoking status: Former Smoker    Types: Cigarettes  . Smokeless tobacco: Never Used  . Tobacco comment: quit 23+years ago  Substance Use Topics  . Alcohol use: Yes    Alcohol/week: 0.6 oz    Types: 1 Glasses of wine per week    Comment: daily  . Drug use: No   Family History  Problem Relation Age of Onset  . Cancer Father 57       colon  . Colon cancer Father   . Colon polyps Father   . Cancer Brother        colon  . Colon cancer  Brother   . Colon polyps Brother   . Esophageal cancer Neg Hx   . Rectal cancer Neg Hx   . Stomach cancer Neg Hx    Allergies  Allergen Reactions  . Atorvastatin Other (See Comments)    REACTION: elevated transamines  . Pravastatin Sodium Other (See Comments)    REACTION: mood problems/irritability/personality change   Current Outpatient Medications on File Prior to Visit  Medication Sig Dispense Refill  . aspirin 81 MG tablet Take 81 mg by mouth daily.    Marland Kitchen ibuprofen (ADVIL,MOTRIN) 200 MG tablet Take 200 mg by mouth every 6 (six) hours as needed for pain.    . Multiple Vitamin (MULTIVITAMIN) tablet Take 1 tablet by mouth daily.     Current Facility-Administered Medications on File Prior to Visit  Medication Dose Route Frequency Provider Last Rate Last Dose  . 0.9 %  sodium chloride infusion  500 mL Intravenous Once Irene Shipper, MD        Review of Systems  Constitutional: Negative for activity change, appetite change, fatigue, fever and  unexpected weight change.  HENT: Negative for congestion, rhinorrhea, sore throat and trouble swallowing.   Eyes: Negative for pain, redness, itching and visual disturbance.  Respiratory: Negative for cough, chest tightness, shortness of breath and wheezing.   Cardiovascular: Negative for chest pain and palpitations.  Gastrointestinal: Negative for abdominal pain, blood in stool, constipation, diarrhea and nausea.  Endocrine: Negative for cold intolerance, heat intolerance, polydipsia and polyuria.  Genitourinary: Negative for difficulty urinating, dysuria, frequency and urgency.  Musculoskeletal: Negative for arthralgias, joint swelling and myalgias.  Skin: Negative for pallor and rash.  Neurological: Negative for dizziness, tremors, weakness, numbness and headaches.  Hematological: Negative for adenopathy. Does not bruise/bleed easily.  Psychiatric/Behavioral: Negative for decreased concentration and dysphoric mood. The patient is not nervous/anxious.        Objective:   Physical Exam  Constitutional: He appears well-developed and well-nourished. No distress.  Well appearing   HENT:  Head: Normocephalic and atraumatic.  Right Ear: External ear normal.  Left Ear: External ear normal.  Nose: Nose normal.  Mouth/Throat: Oropharynx is clear and moist.  Eyes: Pupils are equal, round, and reactive to light. Conjunctivae and EOM are normal. Right eye exhibits no discharge. Left eye exhibits no discharge. No scleral icterus.  Neck: Normal range of motion. Neck supple. No JVD present. Carotid bruit is not present. No thyromegaly present.  Cardiovascular: Normal rate, regular rhythm, normal heart sounds and intact distal pulses. Exam reveals no gallop.  Pulmonary/Chest: Effort normal and breath sounds normal. No respiratory distress. He has no wheezes. He exhibits no tenderness.  Abdominal: Soft. Bowel sounds are normal. He exhibits no distension, no abdominal bruit and no mass. There is no  tenderness.  Musculoskeletal: He exhibits no edema or tenderness.  Lymphadenopathy:    He has no cervical adenopathy.  Neurological: He is alert. He has normal reflexes. No cranial nerve deficit. He exhibits normal muscle tone. Coordination normal.  Skin: Skin is warm and dry. No rash noted. No erythema. No pallor.  Solar lentigines diffusely Some sks   Psychiatric: He has a normal mood and affect.  Pleasant           Assessment & Plan:   Problem List Items Addressed This Visit      Cardiovascular and Mediastinum   Essential hypertension    bp is controlled with lifestyle change bp in fair control at this time  BP Readings from Last 1 Encounters:  07/03/17 134/82   No changes needed Disc lifstyle change with low sodium diet and exercise        Relevant Orders   CBC with Differential/Platelet   Comprehensive metabolic panel   Lipid panel   TSH     Digestive   ADENOCARCINOMA, RECTUM    Doing well/cancer free currently  Keeping up with 5 y colonoscopies         Other   Elevated PSA    Improved at urology last visit  Had 6 mo and 12 mo f/u with them  Doing well       Hyperglycemia    A1C today  disc imp of low glycemic diet and wt loss to prevent DM2  He does have a sweet tooth      Relevant Orders   Hemoglobin A1c   Hyperlipidemia    Lipid panel today  Very high LDL in the past - he is intol of statins  Saw cardiology-had a good CA index on CT and it was not advised that he take PCYK9 inhibitor based on that   Disc goals for lipids and reasons to control them Rev last labs with pt Rev low sat fat diet in detail - he is doing well with that       Relevant Orders   Lipid panel   Routine general medical examination at a health care facility - Primary    Reviewed health habits including diet and exercise and skin cancer prevention Reviewed appropriate screening tests for age  Also reviewed health mt list, fam hx and immunization status , as well as  social and family history   See HPI Labs ordered  amw upcoming today  PPSV23 today  BP is better  Continue urology f/u

## 2017-07-03 NOTE — Assessment & Plan Note (Signed)
A1C today  disc imp of low glycemic diet and wt loss to prevent DM2  He does have a sweet tooth

## 2017-07-03 NOTE — Assessment & Plan Note (Signed)
Improved at urology last visit  Had 6 mo and 12 mo f/u with them  Doing well

## 2017-07-03 NOTE — Assessment & Plan Note (Signed)
Reviewed health habits including diet and exercise and skin cancer prevention Reviewed appropriate screening tests for age  Also reviewed health mt list, fam hx and immunization status , as well as social and family history   See HPI Labs ordered  amw upcoming today  PPSV23 today  BP is better  Continue urology f/u

## 2018-05-30 HISTORY — PX: GUM SURGERY: SHX658

## 2018-07-16 ENCOUNTER — Ambulatory Visit (INDEPENDENT_AMBULATORY_CARE_PROVIDER_SITE_OTHER): Payer: Medicare Other

## 2018-07-16 ENCOUNTER — Ambulatory Visit (INDEPENDENT_AMBULATORY_CARE_PROVIDER_SITE_OTHER): Payer: Medicare Other | Admitting: Family Medicine

## 2018-07-16 ENCOUNTER — Encounter: Payer: Self-pay | Admitting: Family Medicine

## 2018-07-16 VITALS — BP 135/85 | Wt 185.0 lb

## 2018-07-16 DIAGNOSIS — E782 Mixed hyperlipidemia: Secondary | ICD-10-CM | POA: Diagnosis not present

## 2018-07-16 DIAGNOSIS — R7303 Prediabetes: Secondary | ICD-10-CM

## 2018-07-16 DIAGNOSIS — R972 Elevated prostate specific antigen [PSA]: Secondary | ICD-10-CM

## 2018-07-16 DIAGNOSIS — I700 Atherosclerosis of aorta: Secondary | ICD-10-CM | POA: Diagnosis not present

## 2018-07-16 DIAGNOSIS — Z0000 Encounter for general adult medical examination without abnormal findings: Secondary | ICD-10-CM

## 2018-07-16 DIAGNOSIS — C20 Malignant neoplasm of rectum: Secondary | ICD-10-CM

## 2018-07-16 DIAGNOSIS — I10 Essential (primary) hypertension: Secondary | ICD-10-CM

## 2018-07-16 NOTE — Assessment & Plan Note (Signed)
No clinical changes  Unable to tolerate statin medications / has had favorable calcium index on CT scan bp is controlled

## 2018-07-16 NOTE — Assessment & Plan Note (Signed)
bp in fair control at this time  BP Readings from Last 1 Encounters:  07/16/18 135/85   No changes needed- controlled with lifestyle change Most recent labs reviewed  Disc lifstyle change with low sodium diet and exercise

## 2018-07-16 NOTE — Progress Notes (Signed)
PCP notes:   Health maintenance:  No gaps identified  Abnormal screenings:   None  Patient concerns:   None  Nurse concerns:  None  Next PCP appt:   07/16/2018 @ 0830  I reviewed health advisor's note, was available for consultation, and agree with documentation and plan. Loura Pardon MD

## 2018-07-16 NOTE — Assessment & Plan Note (Signed)
Due for labs Eating quite healthy- now retired and prep 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  He will call to schedule labs  disc imp of low glycemic diet and wt loss to prevent DM2

## 2018-07-16 NOTE — Progress Notes (Signed)
Subjective:   [name] is a 68 y.o. male who presents for Medicare Annual/Subsequent preventive examination.  Review of Systems:  N/A Cardiac Risk Factors include: advanced age (>11men, >27 women);male gender;hypertension     Objective:    Vitals: There were no vitals taken for this visit.  There is no height or weight on file to calculate BMI.  Advanced Directives 07/16/2018 07/03/2017 04/12/2017 11/15/2016 06/29/2016  Does Patient Have a Medical Advance Directive? Yes Yes Yes No Yes  Type of Paramedic of [place];Living will;Out of facility DNR (pink MOST or yellow form) [place];Living will Healthcare Power of [place];Living will  Copy of [place] in Chart? No - copy requested No - copy requested - - No - copy requested    Tobacco Social History   Tobacco Use  Smoking Status Former Smoker  . Types: Cigarettes  Smokeless Tobacco Never Used  Tobacco Comment   quit 23+years ago     Counseling given: No Comment: quit 23+years ago   Clinical Intake:  Pre-visit preparation completed: Yes  Pain : No/denies pain Pain Score: 0-No pain     Nutritional Status: BMI 25 -29 Overweight Nutritional Risks: None Diabetes: No  How often do you need to have someone help you when you read instructions, pamphlets, or other written materials from your doctor or pharmacy?: 1 - Never What is the last grade level you completed in school?: Associate degree  Interpreter Needed?: No  Comments: pt lives with spouse Information entered by :: LPinson, LPN  Past Medical History:  Diagnosis Date  . Diverticulosis   . Hyperlipidemia   . Hypertension    borderline not on meds at this time  . Nephrolithiasis   . rectal ca dx'd 2005   rectal   Past Surgical History:  Procedure Laterality Date  . COLONOSCOPY    . enlarged prostate  08/2003   on CT scan  . GANGLION CYST EXCISION Right  05/2016  . GUM SURGERY  05/2018   cyst removal - benign  . LOW ANTERIOR BOWEL RESECTION     Family History  Problem Relation Age of Onset  . Cancer Father 38       colon  . Colon cancer Father   . Colon polyps Father   . Cancer Brother        colon  . Colon cancer Brother   . Colon polyps Brother   . Esophageal cancer Neg Hx   . Rectal cancer Neg Hx   . Stomach cancer Neg Hx    Social History   Socioeconomic History  . Marital status: Married    Spouse name: Not on file  . Number of children: Not on file  . Years of education: Not on file  . Highest education level: Not on file  Occupational History  . Occupation: Engineer, maintenance: [employer]  . Financial resource strain: Not on file  . Food insecurity:    Worry: Not on file    Inability: Not on file  . Transportation needs:    Medical: Not on file    Non-medical: Not on file  Tobacco Use  . Smoking status: Former Smoker    Types: Cigarettes  . Smokeless tobacco: Never Used  . Tobacco comment: quit 23+years ago  Substance and Sexual Activity  . Alcohol use: Yes    Alcohol/week: 7.0 standard drinks    Types: 3  Glasses of wine, 4 Standard drinks or equivalent per week    Comment: daily  . Drug use: No  . Sexual activity: Not Currently  Lifestyle  . Physical activity:    Days per week: Not on file    Minutes per session: Not on file  . Stress: Not on file  Relationships  . Social connections:    Talks on phone: Not on file    Gets together: Not on file    Attends religious service: Not on file    Active member of club or organization: Not on file    Attends meetings of clubs or organizations: Not on file    Relationship status: Not on file  Other Topics Concern  . Not on file  Social History Narrative  . Not on file    Outpatient Encounter Medications as of 07/16/2018  Medication Sig  . ibuprofen (ADVIL,MOTRIN) 200 MG tablet Take 200 mg by mouth every 6 (six) hours as  needed for pain.  . Multiple Vitamin (MULTIVITAMIN) tablet Take 1 tablet by mouth daily.   Facility-Administered Encounter Medications as of 07/16/2018  Medication  . 0.9 %  sodium chloride infusion    Activities of Daily Living In your present state of health, do you have any difficulty performing the following activities: 07/16/2018  Hearing? Y  Vision? N  Difficulty concentrating or making decisions? N  Walking or climbing stairs? N  Dressing or bathing? N  Doing errands, shopping? N  Preparing Food and eating ? N  Using the Toilet? N  In the past six months, have you accidently leaked urine? N  Do you have problems with loss of bowel control? N  Managing your Medications? N  Managing your Finances? N  Housekeeping or managing your Housekeeping? N  Some recent data might be hidden    Patient Care Team: Tower, Wynelle Fanny, MD as PCP - General   Assessment:   This is a routine wellness examination for Watson.  Vision Screening Comments: Last vision exam several years ago  Exercise Activities and Dietary recommendations Current Exercise Habits: Home exercise routine, Type of exercise: calisthenics;walking;Other - see comments(golf), Time (Minutes): 60, Frequency (Times/Week): 7, Weekly Exercise (Minutes/Week): 420, Intensity: Mild, Exercise limited by: None identified  Goals    . DIET - INCREASE WATER INTAKE     Starting 07/03/2017, I will attempt to drink at least 6-8 glasses of water daily.     . 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        Fall Risk Fall Risk  07/16/2018 07/03/2017 06/29/2016  Falls in the past year? 0 No No   Depression Screen PHQ 2/9 Scores 07/16/2018 07/03/2017 06/29/2016  PHQ - 2 Score 0 0 0  PHQ- 9 Score 0 0 -    Cognitive Function MMSE - Mini Mental State Exam 07/16/2018 07/03/2017 06/29/2016  Orientation to time 5 5 5   Orientation to Place 5 5 5    Registration 3 3 3   Attention/ Calculation 0 0 0  Recall 3 3 3   Language- name 2 objects 0 0 0  Language- repeat 1 1 1   Language- follow 3 step command 0 3 3  Language- read & follow direction 0 0 0  Write a sentence 0 0 0  Copy design 0 0 0  Total score 17 20 20      PLEASE NOTE: A Mini-Cog  screen was completed. Maximum score is 17. A value of 0 denotes this part of [place] MMSE was not completed or the patient failed this part of the Mini-Cog screening.   Mini-Cog Screening Orientation to Time - Max 5 pts Orientation to Place - Max 5 pts Registration - Max 3 pts Recall - Max 3 pts Language Repeat - Max 1 pts      Immunization History  Administered Date(s) Administered  . Influenza, High Dose Seasonal PF 01/11/2018  . Influenza,inj,Quad PF,6+ Mos 12/30/2016  . Pneumococcal Conjugate-13 06/06/2016  . Pneumococcal Polysaccharide-23 07/03/2017  . Td 07/07/2003, 07/12/2016    Screening Tests Health Maintenance  Topic Date Due  . INFLUENZA VACCINE  09/29/2018  . COLONOSCOPY  04/12/2022  . TETANUS/TDAP  07/13/2026  . Hepatitis C Screening  Completed  . PNA vac Low Risk Adult  Completed       Plan:     I have personally reviewed, addressed, and noted the following in the patient's chart:  A. Medical and social history B. Use of alcohol, tobacco or illicit drugs  C. Current medications and supplements D. Functional ability and status E.  Nutritional status F.  Physical activity G. Advance directives H. List of other physicians I.  Hospitalizations, surgeries, and ER visits in previous 12 months J.  Vitals (unless it is a telemedicine encounter) K. Screenings to include cognitive, depression, hearing, vision (NOTE: hearing and vision screenings not completed in telemedicine encounter) L. Referrals and appointments   In addition, I have reviewed and discussed with patient certain preventive protocols, quality metrics, and best practice recommendations. A written  personalized care plan for preventive services and recommendations were provided to patient.  With patient's permission, we connected on 07/16/18 at  8:00 AM EDT by a video enabled telemedicine application. Two patient identifiers were used to ensure the encounter occurred with the correct person.    Patient was in home and writer was in office.   Signed,   Lindell Noe, MHA, BS, LPN Health Coach

## 2018-07-16 NOTE — Assessment & Plan Note (Signed)
Pt continues to f/u with urology yearly for exam and psa Per pt-back to normal range  No clinical changes

## 2018-07-16 NOTE — Progress Notes (Signed)
Virtual Visit via Video Note  I connected with [name] on 07/16/18 at  8:30 AM EDT by a video enabled telemedicine application and verified that I am speaking with the correct person using two identifiers.  Location: Patient: home Provider: office    I discussed the limitations of evaluation and management by telemedicine and the availability of in person appointments. The patient expressed understanding and agreed to proceed.  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  Getting a puppy   Feeling very well   He had amw with Lesia today  No concerns or gaps    Wt Readings from Last 3 Encounters:  07/03/17 191 lb 12 oz (87 kg)  07/03/17 191 lb 12 oz (87 kg)  04/12/17 200 lb (90.7 kg)  his weight at home 185 lb    HTN= 135/85 at home (on his cuff which runs higher than ours)  No cp or palpitations or headaches or edema  This is controlled with lifestyle change  BP Readings from Last 3 Encounters:  07/03/17 134/82  07/03/17 134/82  04/12/17 (!) 141/80     Eating a healthy diet/not eating out  Avoids junk    Lab Results  Component Value Date   CREATININE 1.03 07/03/2017   BUN 16 07/03/2017   NA 143 07/03/2017   K 4.4 07/03/2017   CL 103 07/03/2017   CO2 32 07/03/2017   Due for labs  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 Also strong fam hx of colon cancer (parent and brother)  Colonoscopy was 2/19 - adenomatous polyps with 5 y recall   H/o elevated psa  Doing very well- last few visits with urology- numbers have come down to the normal range -and continue to go down  Still goes yearly   Lab Results  Component Value Date   PSA 5.87 (H) 06/06/2016   PSA 3.57 06/14/2011   PSA 1.89 09/08/2008   no nocturia  Stream is slower than it was when he was younger -that is not bad    H/o prediabetes Lab Results  Component Value Date   HGBA1C 6.1 07/03/2017    Hyperlipidemia Lab Results  Component Value Date   CHOL 320 (H) 07/03/2017   HDL 49.90 07/03/2017   LDLDIRECT 216.0 07/03/2017   TRIG 309.0 (H) 07/03/2017   CHOLHDL 6 07/03/2017   Due for labs Diet controlled  Intol of statins unfortunately  Had a good calcium index on CT so cardiology did not think that he was a candidate for PCYK9 inhibitor   Will schedule lab appt -he will call   Review of Systems  Constitutional: Negative for chills, fever and malaise/fatigue.  HENT: Negative for hearing loss and sore throat.   Eyes: Negative for blurred vision, discharge and redness.  Respiratory: Negative for cough and shortness of breath.   Cardiovascular: Negative for chest pain, palpitations and leg swelling.  Gastrointestinal: Negative for abdominal pain, blood in stool, heartburn and nausea.  Genitourinary: Negative for dysuria, hematuria and urgency.  Musculoskeletal: Negative for joint pain and myalgias.  Skin: Negative for itching and rash.  Neurological: Negative for dizziness and headaches.  Psychiatric/Behavioral: Negative for depression.     Patient Active Problem List   Diagnosis Date Noted  . Coronary artery calcification seen on CT scan 03/19/2017  . Aortic atherosclerosis ([place]) 03/19/2017  . Essential hypertension 06/06/2016  . Elevated PSA 06/16/2011  . Routine general  medical examination at a health care facility 06/16/2011  . Prediabetes 06/14/2011  . Prostate cancer screening 06/14/2011  . Hyperlipidemia 09/08/2008  . ADENOCARCINOMA, RECTUM 03/08/2007  . NEPHROLITHIASIS, HX OF 03/08/2007   Past Medical History:  Diagnosis Date  . Diverticulosis   . Hyperlipidemia   . Hypertension    borderline not on meds at this time  . Nephrolithiasis   . rectal ca dx'd 2005   rectal   Past Surgical History:  Procedure Laterality Date  . COLONOSCOPY    . enlarged prostate  08/2003   on CT scan  . GANGLION CYST EXCISION Right 05/2016  . GUM SURGERY  05/2018    cyst removal - benign  . LOW ANTERIOR BOWEL RESECTION     Social History   Tobacco Use  . Smoking status: Former Smoker    Types: Cigarettes  . Smokeless tobacco: Never Used  . Tobacco comment: quit 23+years ago  Substance Use Topics  . Alcohol use: Yes    Alcohol/week: 7.0 standard drinks    Types: 3 Glasses of wine, 4 Standard drinks or equivalent per week    Comment: daily  . Drug use: No   Family History  Problem Relation Age of Onset  . Cancer Father 2       colon  . Colon cancer Father   . Colon polyps Father   . Cancer Brother        colon  . Colon cancer Brother   . Colon polyps Brother   . Esophageal cancer Neg Hx   . Rectal cancer Neg Hx   . Stomach cancer Neg Hx    Allergies  Allergen Reactions  . Atorvastatin Other (See Comments)    REACTION: elevated transamines  . Pravastatin Sodium Other (See Comments)    REACTION: mood problems/irritability/personality change   Current Outpatient Medications on File Prior to Visit  Medication Sig Dispense Refill  . ibuprofen (ADVIL,MOTRIN) 200 MG tablet Take 200 mg by mouth every 6 (six) hours as needed for pain.    . Multiple Vitamin (MULTIVITAMIN) tablet Take 1 tablet by mouth daily.     Current Facility-Administered Medications on File Prior to Visit  Medication Dose Route Frequency Provider Last Rate Last Dose  . 0.9 %  sodium chloride infusion  500 mL Intravenous Once Irene Shipper, MD          Observations/Objective: Patient appears well, in no distress Weight is baseline  No facial swelling or asymmetry Normal voice-not hoarse and no slurred speech No obvious tremor or mobility impairment Moving neck and UEs normally Able to hear the call well  No cough or shortness of breath during interview  Talkative and mentally sharp with no cognitive changes- pleasant  No skin changes on face or neck , no rash or pallor Affect is normal (very good mood)    Assessment and Plan: Problem List Items Addressed  This Visit      Cardiovascular and Mediastinum   Essential hypertension    bp in fair control at this time  BP Readings from Last 1 Encounters:  07/16/18 135/85   No changes needed- controlled with lifestyle change Most recent labs reviewed  Disc lifstyle change with low sodium diet and exercise        Aortic atherosclerosis ([facility])    No clinical changes  Unable to tolerate statin medications / has had favorable calcium index on CT scan bp is controlled        Digestive   ADENOCARCINOMA,  RECTUM    No clinical changes  2/19 last colonoscopy with polyps  5 y recall planned Strong fam hx -aware        Other   Hyperlipidemia - Primary    Very high LDL in the past Due for labs-will plan  Favorable ca index on CT- cardiology did not think he was a candidate for PCYK9  Intol to statins Good diet       Prediabetes    Due for labs Eating quite healthy- now retired and prep 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  He will call to schedule labs  disc imp of low glycemic diet and wt loss to prevent DM2       Elevated PSA    Pt continues to f/u with urology yearly for exam and psa Per pt-back to normal range  No clinical changes           Follow Up Instructions: Keep taking good care of yourself with healthy diet and exercise  To prevent diabetes Try to get most of your carbohydrates from produce (with the exception of white potatoes)  Eat less bread/pasta/rice/snack foods/cereals/sweets and other items from the middle of the grocery store (processed carbs)  For high cholesterol Avoid red meat/ fried foods/ egg yolks/ fatty breakfast meats/ butter, cheese and high fat dairy/ and shellfish    Continue yearly urology visits for prostate surveillance  Call us when you are ready to schedule labs here   If you are interested in the new shingles vaccine (Shingrix) - call your local pharmacy to check on coverage and availability  If affordable, get on a wait list at your pharmacy to get the  vaccine.   I discussed the assessment and treatment plan with the patient. The patient was provided an opportunity to ask questions and all were answered. The patient agreed with the plan and demonstrated an understanding of the instructions.   The patient was advised to call back or seek an in-person evaluation if the symptoms worsen or if the condition fails to improve as anticipated.     Loura Pardon, MD

## 2018-07-16 NOTE — Patient Instructions (Signed)
Keep taking good care of yourself with healthy diet and exercise  To prevent diabetes Try to get most of your carbohydrates from produce (with the exception of white potatoes)  Eat less bread/pasta/rice/snack foods/cereals/sweets and other items from the middle of the grocery store (processed carbs)  For high cholesterol Avoid red meat/ fried foods/ egg yolks/ fatty breakfast meats/ butter, cheese and high fat dairy/ and shellfish    Continue yearly urology visits for prostate surveillance  Call us when you are ready to schedule labs here   If you are interested in the new shingles vaccine (Shingrix) - call your local pharmacy to check on coverage and availability  If affordable, get on a wait list at your pharmacy to get the vaccine.

## 2018-07-16 NOTE — Assessment & Plan Note (Signed)
No clinical changes  2/19 last colonoscopy with polyps  5 y recall planned Strong fam hx -aware

## 2018-07-16 NOTE — Patient Instructions (Signed)
Mr. Zoeller , Thank you for taking time to come for your Medicare Wellness Visit. I appreciate your ongoing commitment to your health goals. Please review the following plan we discussed and let me know if I can assist you in the future.   These are the goals we discussed: Goals    . Patient Stated     Starting 07/16/2018, I will continue to exercise for at least 60 minutes daily.        This is a list of the screening recommended for you and due dates:  Health Maintenance  Topic Date Due  . Flu Shot  09/29/2018  . Colon Cancer Screening  04/12/2022  . Tetanus Vaccine  07/13/2026  .  Hepatitis C: One time screening is recommended by Center for Disease Control  (CDC) for  adults born from [birth year] through 1966.   Completed  . Pneumonia vaccines  Completed   Preventive Care for Adults  A healthy lifestyle and preventive care can promote health and wellness. Preventive health guidelines for adults include the following key practices.  . A routine yearly physical is a good way to check with your health care provider about your health and preventive screening. It is a chance to share any concerns and updates on your health and to receive a thorough exam.  . Visit your dentist for a routine exam and preventive care every 6 months. Brush your teeth twice a day and floss once a day. Good oral hygiene prevents tooth decay and gum disease.  . The frequency of eye exams is based on your age, health, family medical history, use  of contact lenses, and other factors. Follow your health care provider's recommendations for frequency of eye exams.  . Eat a healthy diet. Foods like vegetables, fruits, whole grains, low-fat dairy products, and lean protein foods contain the nutrients you need without too many calories. Decrease your intake of foods high in solid fats, added sugars, and salt. Eat the right amount of calories for you. Get information about a proper diet from your health care provider, if  necessary.  . Regular physical exercise is one of the most important things you can do for your health. Most adults should get at least 150 minutes of moderate-intensity exercise (any activity that increases your heart rate and causes you to sweat) each week. In addition, most adults need muscle-strengthening exercises on 2 or more days a week.  Silver Sneakers may be a benefit available to you. To determine eligibility, you may visit the website: www.silversneakers.com or contact program at 540-008-7700 Mon-Fri between 8AM-8PM.   . Maintain a healthy weight. The body mass index (BMI) is a screening tool to identify possible weight problems. It provides an estimate of body fat based on height and weight. Your health care provider can find your BMI and can help you achieve or maintain a healthy weight.   For adults 20 years and older: ? A BMI below 18.5 is considered underweight. ? A BMI of 18.5 to 24.9 is normal. ? A BMI of 25 to 29.9 is considered overweight. ? A BMI of 30 and above is considered obese.   . Maintain normal blood lipids and cholesterol levels by exercising and minimizing your intake of saturated fat. Eat a balanced diet with plenty of fruit and vegetables. Blood tests for lipids and cholesterol should begin at age 44 and be repeated every 5 years. If your lipid or cholesterol levels are high, you are over 50, or you are at  high risk for heart disease, you may need your cholesterol levels checked more frequently. Ongoing high lipid and cholesterol levels should be treated with medicines if diet and exercise are not working.  . If you smoke, find out from your health care provider how to quit. If you do not use tobacco, please do not start.  . If you choose to drink alcohol, please do not consume more than 2 drinks per day. One drink is considered to be 12 ounces (355 mL) of beer, 5 ounces (148 mL) of wine, or 1.5 ounces (44 mL) of liquor.  . If you are 37-25 years old, ask your  health care provider if you should take aspirin to prevent strokes.  . Use sunscreen. Apply sunscreen liberally and repeatedly throughout the day. You should seek shade when your shadow is shorter than you. Protect yourself by wearing long sleeves, pants, a wide-brimmed hat, and sunglasses year round, whenever you are outdoors.  . Once a month, do a whole body skin exam, using a mirror to look at the skin on your back. Tell your health care provider of new moles, moles that have irregular borders, moles that are larger than a pencil eraser, or moles that have changed in shape or color.

## 2018-07-16 NOTE — Assessment & Plan Note (Signed)
Very high LDL in the past Due for labs-will plan  Favorable ca index on CT- cardiology did not think he was a candidate for PCYK9  Intol to statins Good diet

## 2019-05-17 IMAGING — DX DG CHEST 2V
2 series · 2 of 2 positions shown · non-contrast
Comparison: CT 04/16/2012

CLINICAL DATA: Shortness of breath

EXAM:
CHEST  2 VIEW

[chest pa]
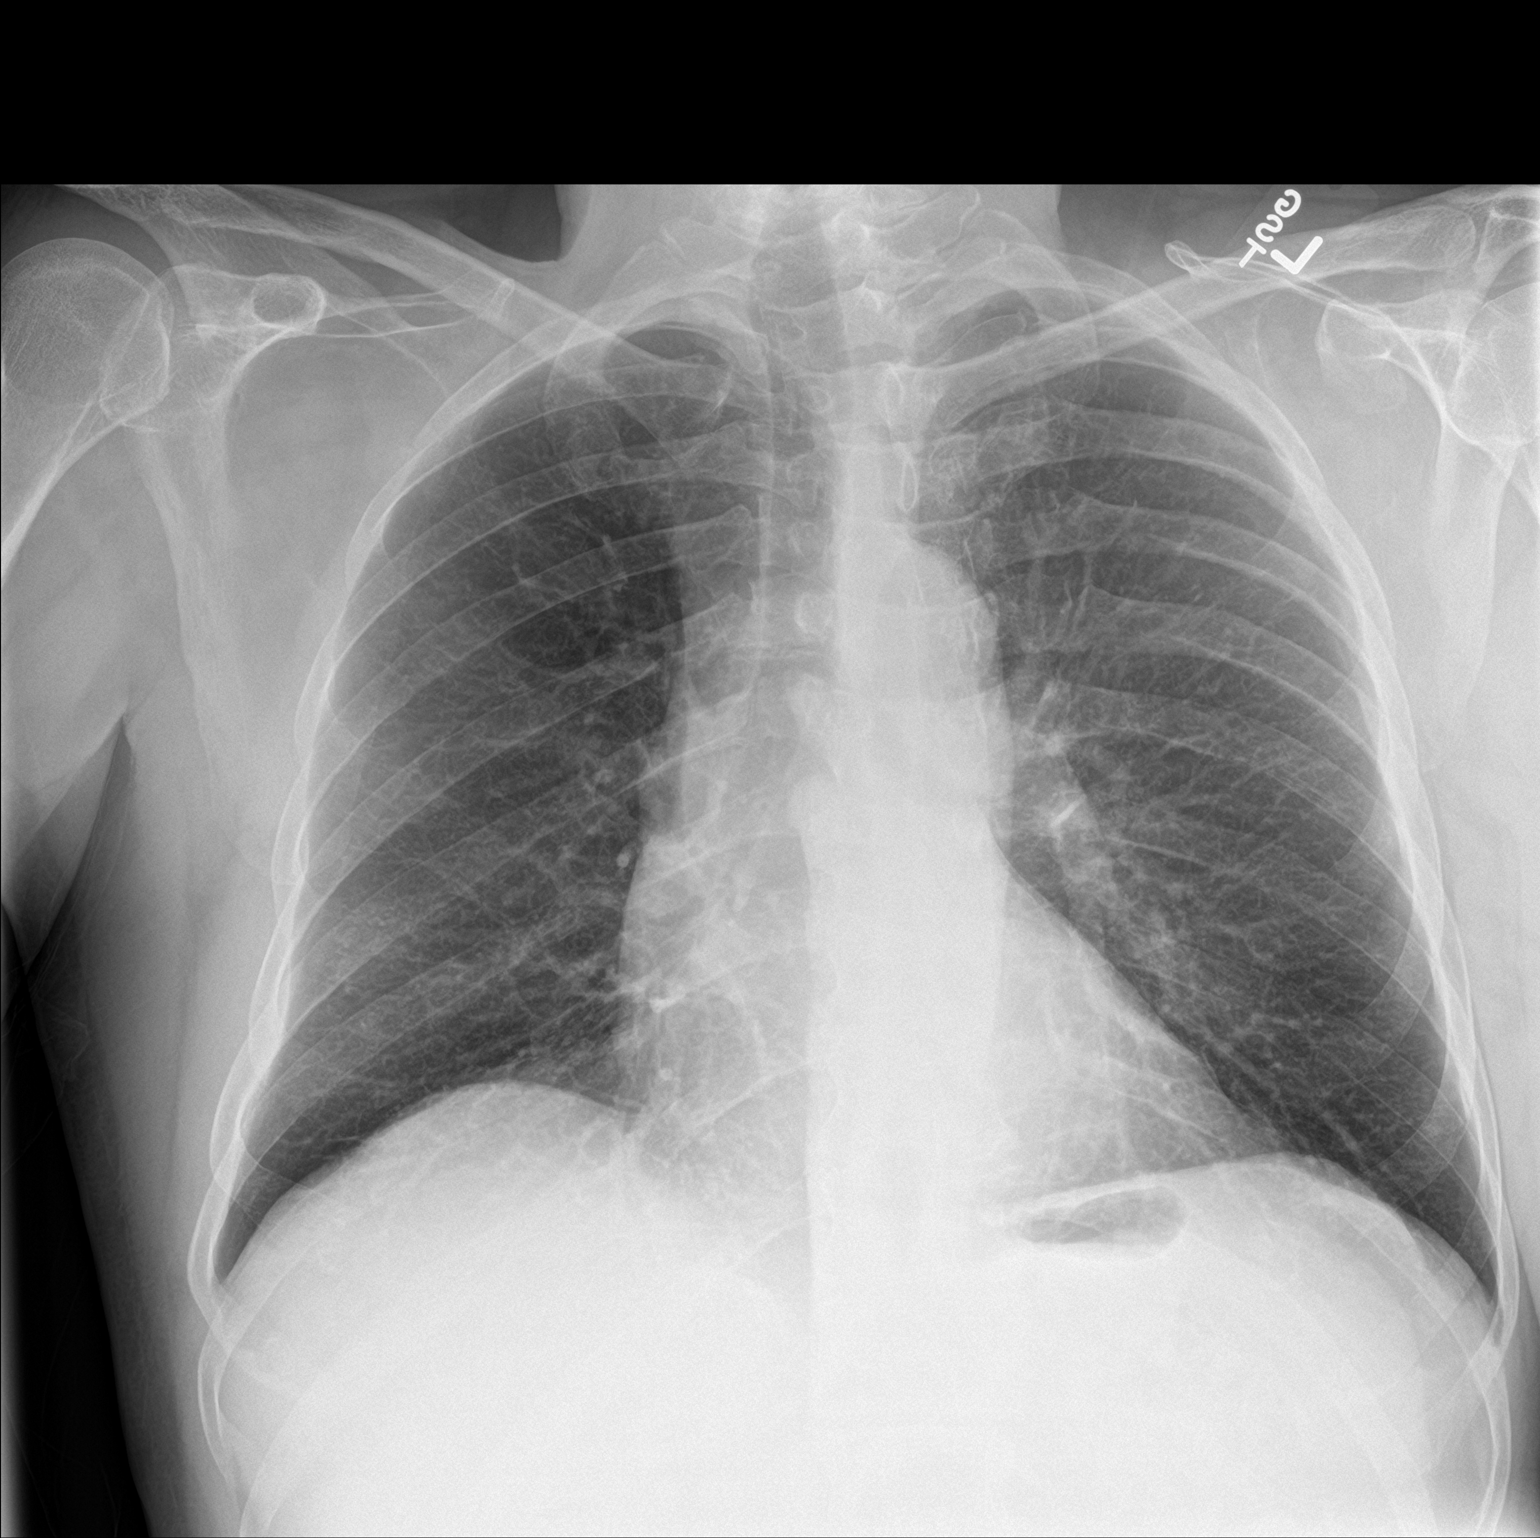

[chest lat]
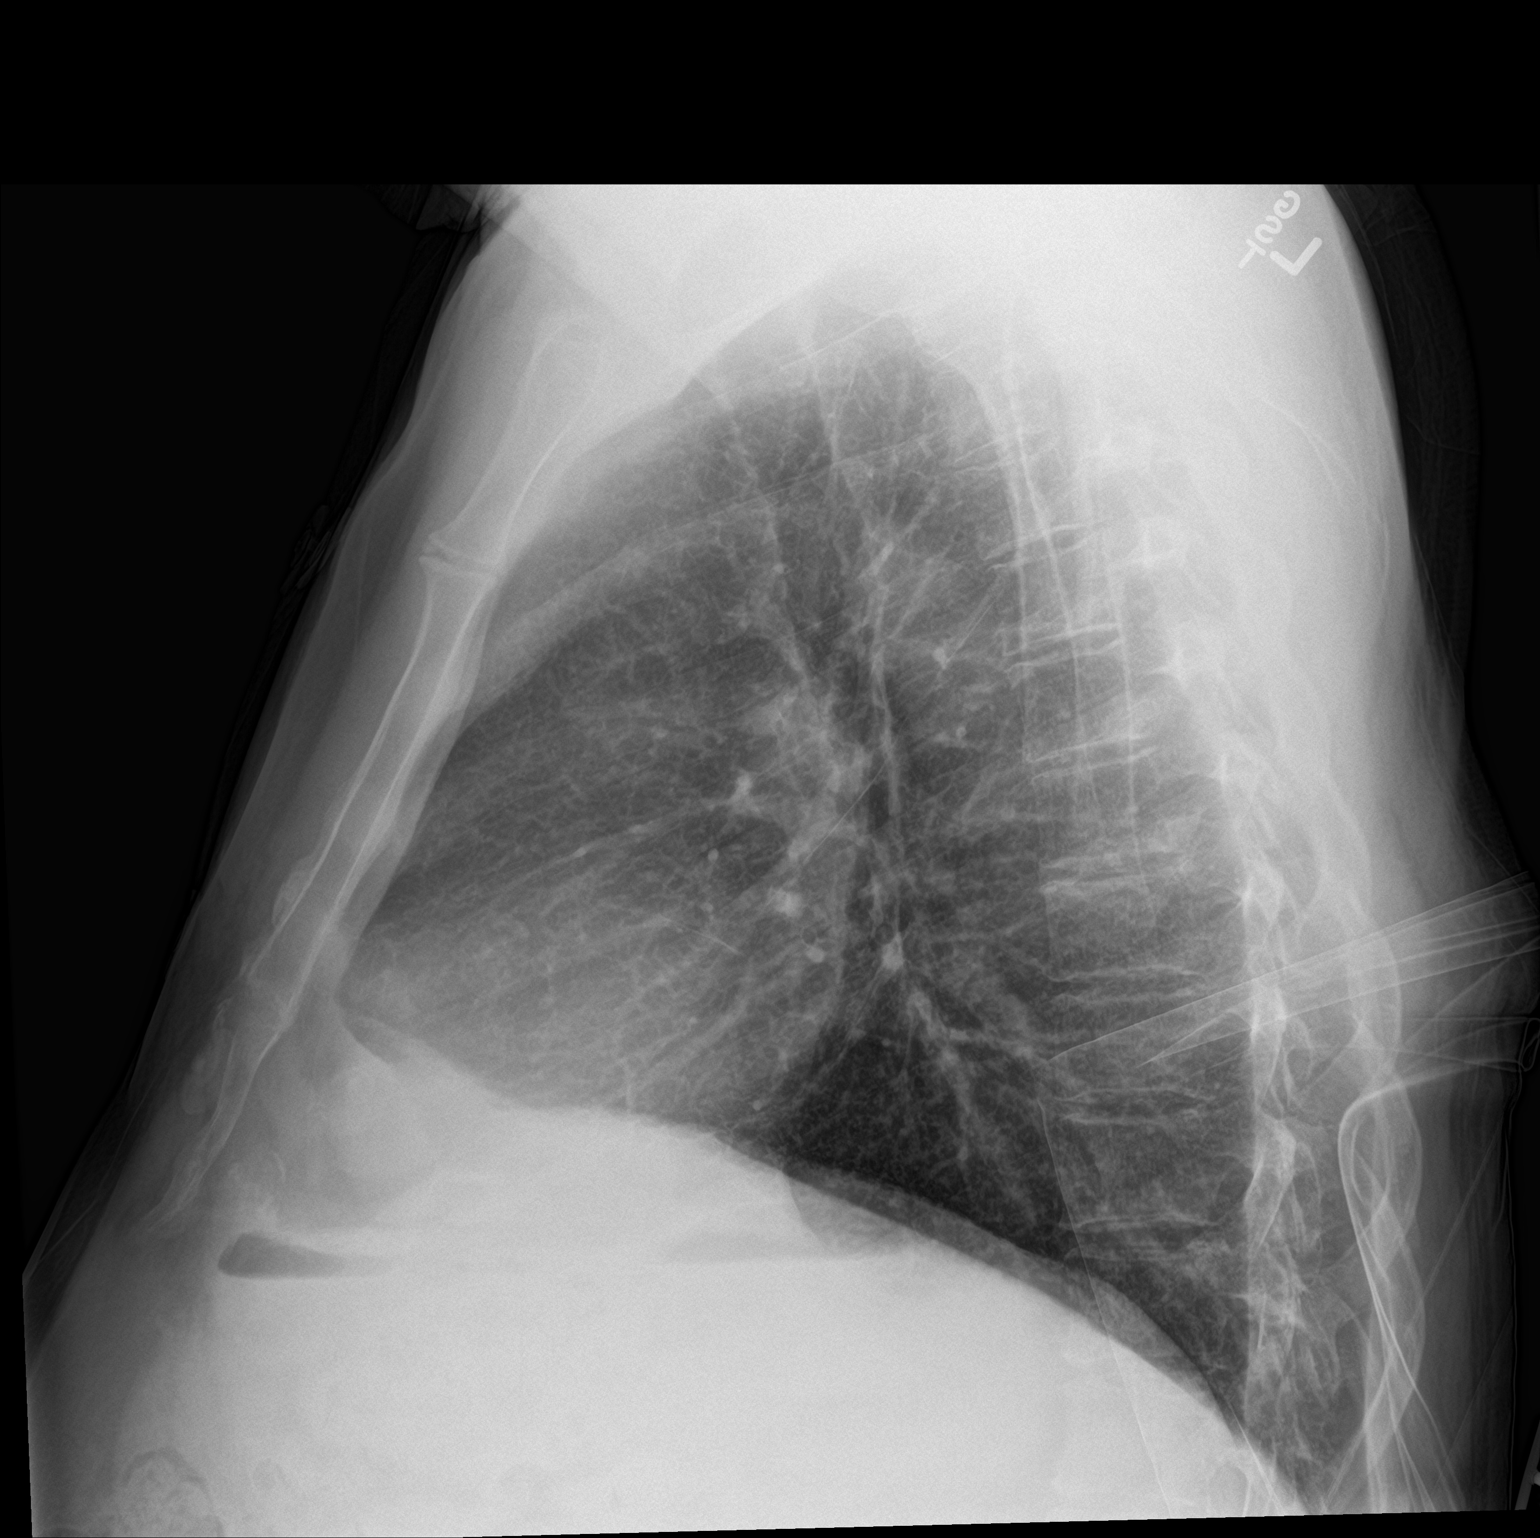

[2 of 2 positions shown; findings below may reference images not displayed]

FINDINGS: No acute consolidation or effusion. Mild hyperinflation. Normal
cardiomediastinal silhouette. No pneumothorax.
IMPRESSION: No active cardiopulmonary disease.

## 2019-08-01 ENCOUNTER — Ambulatory Visit (INDEPENDENT_AMBULATORY_CARE_PROVIDER_SITE_OTHER): Payer: Medicare Other

## 2019-08-01 DIAGNOSIS — Z0000 Encounter for general adult medical examination without abnormal findings: Secondary | ICD-10-CM

## 2019-08-01 NOTE — Patient Instructions (Signed)
Mr. [name] , Thank you for taking time to come for your Medicare Wellness Visit. I appreciate your ongoing commitment to your health goals. Please review the following plan we discussed and let me know if I can assist you in the future.   Screening recommendations/referrals: Colonoscopy: Up to date, completed 04/12/2017 Recommended yearly ophthalmology/optometry visit for glaucoma screening and checkup Recommended yearly dental visit for hygiene and checkup  Vaccinations: Influenza vaccine: Fall 2021 Pneumococcal vaccine: Completed series Tdap vaccine: Up to date, completed 07/12/2016 Shingles vaccine: discussed    Advanced directives: Please bring a copy of your POA (Power of [place]) and/or Living Will to your next appointment.   Conditions/risks identified: hypertension, hyperlipidemia  Next appointment: none  Preventive Care 70 Years and Older, Male Preventive care refers to lifestyle choices and visits with your health care provider that can promote health and wellness. What does preventive care include?  A yearly physical exam. This is also called an annual well check.  Dental exams once or twice a year.  Routine eye exams. Ask your health care provider how often you should have your eyes checked.  Personal lifestyle choices, including:  Daily care of your teeth and gums.  Regular physical activity.  Eating a healthy diet.  Avoiding tobacco and drug use.  Limiting alcohol use.  Practicing safe sex.  Taking low doses of aspirin every day.  Taking vitamin and mineral supplements as recommended by your health care provider. What happens during an annual well check? The services and screenings done by your health care provider during your annual well check will depend on your age, overall health, lifestyle risk factors, and family history of disease. Counseling  Your health care provider may ask you questions about your:  Alcohol use.  Tobacco use.  Drug  use.  Emotional well-being.  Home and relationship well-being.  Sexual activity.  Eating habits.  History of falls.  Memory and ability to understand (cognition).  Work and work Statistician. Screening  You may have the following tests or measurements:  Height, weight, and BMI.  Blood pressure.  Lipid and cholesterol levels. These may be checked every 5 years, or more frequently if you are over 70 years old.  Skin check.  Lung cancer screening. You may have this screening every year starting at age 39 if you have a 30-pack-year history of smoking and currently smoke or have quit within the past 15 years.  Fecal occult blood test (FOBT) of the stool. You may have this test every year starting at age 70.  Flexible sigmoidoscopy or colonoscopy. You may have a sigmoidoscopy every 5 years or a colonoscopy every 10 years starting at age 70.  Prostate cancer screening. Recommendations will vary depending on your family history and other risks.  Hepatitis C blood test.  Hepatitis B blood test.  Sexually transmitted disease (STD) testing.  Diabetes screening. This is done by checking your blood sugar (glucose) after you have not eaten for a while (fasting). You may have this done every 1-3 years.  Abdominal aortic aneurysm (AAA) screening. You may need this if you are a current or former smoker.  Osteoporosis. You may be screened starting at age 70 if you are at high risk. Talk with your health care provider about your test results, treatment options, and if necessary, the need for more tests. Vaccines  Your health care provider may recommend certain vaccines, such as:  Influenza vaccine. This is recommended every year.  Tetanus, diphtheria, and acellular pertussis (Tdap, Td)  vaccine. You may need a Td booster every 10 years.  Zoster vaccine. You may need this after age 70.  Pneumococcal 13-valent conjugate (PCV13) vaccine. One dose is recommended after age  70.  Pneumococcal polysaccharide (PPSV23) vaccine. One dose is recommended after age 70. Talk to your health care provider about which screenings and vaccines you need and how often you need them. This information is not intended to replace advice given to you by your health care provider. Make sure you discuss any questions you have with your health care provider. Document Released: 03/13/2015 Document Revised: 11/04/2015 Document Reviewed: 12/16/2014 Elsevier Interactive Patient Education  2017 [place] Prevention in the Home Falls can cause injuries. They can happen to people of all ages. There are many things you can do to make your home safe and to help prevent falls. What can I do on the outside of my home?  Regularly fix the edges of walkways and driveways and fix any cracks.  Remove anything that might make you trip as you walk through a door, such as a raised step or threshold.  Trim any bushes or trees on the path to your home.  Use bright outdoor lighting.  Clear any walking paths of anything that might make someone trip, such as rocks or tools.  Regularly check to see if handrails are loose or broken. Make sure that both sides of any steps have handrails.  Any raised decks and porches should have guardrails on the edges.  Have any leaves, snow, or ice cleared regularly.  Use sand or salt on walking paths during winter.  Clean up any spills in your garage right away. This includes oil or grease spills. What can I do in the bathroom?  Use night lights.  Install grab bars by the toilet and in the tub and shower. Do not use towel bars as grab bars.  Use non-skid mats or decals in the tub or shower.  If you need to sit down in the shower, use a plastic, non-slip stool.  Keep the floor dry. Clean up any water that spills on the floor as soon as it happens.  Remove soap buildup in the tub or shower regularly.  Attach bath mats securely with double-sided  non-slip rug tape.  Do not have throw rugs and other things on the floor that can make you trip. What can I do in the bedroom?  Use night lights.  Make sure that you have a light by your bed that is easy to reach.  Do not use any sheets or blankets that are too big for your bed. They should not hang down onto the floor.  Have a firm chair that has side arms. You can use this for support while you get dressed.  Do not have throw rugs and other things on the floor that can make you trip. What can I do in the kitchen?  Clean up any spills right away.  Avoid walking on wet floors.  Keep items that you use a lot in easy-to-reach places.  If you need to reach something above you, use a strong step stool that has a grab bar.  Keep electrical cords out of the way.  Do not use floor polish or wax that makes floors slippery. If you must use wax, use non-skid floor wax.  Do not have throw rugs and other things on the floor that can make you trip. What can I do with my stairs?  Do not leave  any items on the stairs.  Make sure that there are handrails on both sides of the stairs and use them. Fix handrails that are broken or loose. Make sure that handrails are as long as the stairways.  Check any carpeting to make sure that it is firmly attached to the stairs. Fix any carpet that is loose or worn.  Avoid having throw rugs at the top or bottom of the stairs. If you do have throw rugs, attach them to the floor with carpet tape.  Make sure that you have a light switch at the top of the stairs and the bottom of the stairs. If you do not have them, ask someone to add them for you. What else can I do to help prevent falls?  Wear shoes that:  Do not have high heels.  Have rubber bottoms.  Are comfortable and fit you well.  Are closed at the toe. Do not wear sandals.  If you use a stepladder:  Make sure that it is fully opened. Do not climb a closed stepladder.  Make sure that both  sides of the stepladder are locked into place.  Ask someone to hold it for you, if possible.  Clearly mark and make sure that you can see:  Any grab bars or handrails.  First and last steps.  Where the edge of each step is.  Use tools that help you move around (mobility aids) if they are needed. These include:  Canes.  Walkers.  Scooters.  Crutches.  Turn on the lights when you go into a dark area. Replace any light bulbs as soon as they burn out.  Set up your furniture so you have a clear path. Avoid moving your furniture around.  If any of your floors are uneven, fix them.  If there are any pets around you, be aware of where they are.  Review your medicines with your doctor. Some medicines can make you feel dizzy. This can increase your chance of falling. Ask your doctor what other things that you can do to help prevent falls. This information is not intended to replace advice given to you by your health care provider. Make sure you discuss any questions you have with your health care provider. Document Released: 12/11/2008 Document Revised: 07/23/2015 Document Reviewed: 03/21/2014 Elsevier Interactive Patient Education  2017 [employer].

## 2019-08-01 NOTE — Progress Notes (Signed)
Subjective:   [name] is a 69 y.o. male who presents for Medicare Annual/Subsequent preventive examination.  Review of Systems: N/A   I connected with the patient today by telephone and verified that I am speaking with the correct person using two identifiers. Location patient: home Location nurse: work Persons participating in the virtual visit: patient, Marine scientist.   I discussed the limitations, risks, security and privacy concerns of performing an evaluation and management service by telephone and the availability of in person appointments. I also discussed with the patient that there may be a patient responsible charge related to this service. The patient expressed understanding and verbally consented to this telephonic visit.    Interactive audio and video telecommunications were attempted between this nurse and patient, however failed, due to patient having technical difficulties OR patient did not have access to video capability.  We continued and completed visit with audio only.     Cardiac Risk Factors include: advanced age (>38men, >59 women);male gender;hypertension;dyslipidemia     Objective:    Vitals: There were no vitals taken for this visit.  There is no height or weight on file to calculate BMI.  Advanced Directives 08/01/2019 07/16/2018 07/03/2017 04/12/2017 11/15/2016 06/29/2016  Does Patient Have a Medical Advance Directive? Yes Yes Yes Yes No Yes  Type of Paramedic of [place];Living will [place];Living will;Out of facility DNR (pink MOST or yellow form) [place];Living will Healthcare Power of [place];Living will  Copy of [place] in Chart? No - copy requested No - copy requested No - copy requested - - No - copy requested    Tobacco Social History   Tobacco Use  Smoking Status Former Smoker  . Types: Cigarettes  Smokeless Tobacco Never Used    Tobacco Comment   quit 23+years ago     Counseling given: Not Answered Comment: quit 23+years ago   Clinical Intake:  Pre-visit preparation completed: Yes  Pain : No/denies pain     Nutritional Risks: None Diabetes: No  How often do you need to have someone help you when you read instructions, pamphlets, or other written materials from your doctor or pharmacy?: 1 - Never What is the last grade level you completed in school?: [employer]  Interpreter Needed?: No  Information entered by :: CJohnson, LPN  Past Medical History:  Diagnosis Date  . Diverticulosis   . Hyperlipidemia   . Hypertension    borderline not on meds at this time  . Nephrolithiasis   . rectal ca dx'd 2005   rectal   Past Surgical History:  Procedure Laterality Date  . COLONOSCOPY    . enlarged prostate  08/2003   on CT scan  . GANGLION CYST EXCISION Right 05/2016  . GUM SURGERY  05/2018   cyst removal - benign  . LOW ANTERIOR BOWEL RESECTION     Family History  Problem Relation Age of Onset  . Cancer Father 41       colon  . Colon cancer Father   . Colon polyps Father   . Cancer Brother        colon  . Colon cancer Brother   . Colon polyps Brother   . Esophageal cancer Neg Hx   . Rectal cancer Neg Hx   . Stomach cancer Neg Hx    Social History   Socioeconomic History  . Marital status: Married    Spouse name: Not on file  .  Number of children: Not on file  . Years of education: Not on file  . Highest education level: Not on file  Occupational History  . Occupation: Engineer, maintenance: [employer]  Tobacco Use  . Smoking status: Former Smoker    Types: Cigarettes  . Smokeless tobacco: Never Used  . Tobacco comment: quit 23+years ago  Substance and Sexual Activity  . Alcohol use: Yes    Alcohol/week: 7.0 standard drinks    Types: 3 Glasses of wine, 4 Standard drinks or equivalent per week    Comment: daily  . Drug use: No  . Sexual activity: Not Currently   Other Topics Concern  . Not on file  Social History Narrative  . Not on file   Social Determinants of Health   Financial Resource Strain: Low Risk   . Difficulty of Paying Living Expenses: Not hard at all  Food Insecurity: No Food Insecurity  . Worried About Charity fundraiser in the Last Year: Never true  . Ran Out of Food in the Last Year: Never true  Transportation Needs: No Transportation Needs  . Lack of Transportation (Medical): No  . Lack of Transportation (Non-Medical): No  Physical Activity: Sufficiently Active  . Days of Exercise per Week: 3 days  . Minutes of Exercise per Session: 60 min  Stress: No Stress Concern Present  . Feeling of Stress : Not at all  Social Connections:   . Frequency of Communication with Friends and Family:   . Frequency of Social Gatherings with Friends and Family:   . Attends Religious Services:   . Active Member of Clubs or Organizations:   . Attends Archivist Meetings:   Marland Kitchen Marital Status:     Outpatient Encounter Medications as of 08/01/2019  Medication Sig  . ibuprofen (ADVIL,MOTRIN) 200 MG tablet Take 200 mg by mouth every 6 (six) hours as needed for pain.  . Multiple Vitamin (MULTIVITAMIN) tablet Take 1 tablet by mouth daily.   Facility-Administered Encounter Medications as of 08/01/2019  Medication  . 0.9 %  sodium chloride infusion    Activities of Daily Living In your present state of health, do you have any difficulty performing the following activities: 08/01/2019  Hearing? Y  Comment ringing in ears  Vision? N  Difficulty concentrating or making decisions? N  Walking or climbing stairs? N  Dressing or bathing? N  Doing errands, shopping? N  Preparing Food and eating ? N  Using the Toilet? N  In the past six months, have you accidently leaked urine? N  Do you have problems with loss of bowel control? N  Managing your Medications? N  Managing your Finances? N  Housekeeping or managing your Housekeeping? N   Some recent data might be hidden    Patient Care Team: Tower, Wynelle Fanny, MD as PCP - General   Assessment:   This is a routine wellness examination for [name].  Exercise Activities and Dietary recommendations Current Exercise Habits: Home exercise routine, Type of exercise: walking, Time (Minutes): 60, Frequency (Times/Week): 3, Weekly Exercise (Minutes/Week): 180, Intensity: Moderate, Exercise limited by: None identified  Goals    . Patient Stated     Starting 07/16/2018, I will continue to exercise for at least 60 minutes daily.     . Patient Stated     08/01/2019, I will continue to walk 2 miles 2-3 times a week.        Fall Risk Fall Risk  08/01/2019 07/16/2018 07/03/2017  06/29/2016  Falls in the past year? 0 0 No No  Number falls in past yr: 0 - - -  Injury with Fall? 0 - - -  Risk for fall due to : No Fall Risks - - -  Follow up Falls evaluation completed;Falls prevention discussed - - -   Is the patient's home free of loose throw rugs in walkways, pet beds, electrical cords, etc?   yes      Grab bars in the bathroom? no      Handrails on the stairs?   yes      Adequate lighting?   yes  Timed Get Up and Go Performed: N/A  Depression Screen PHQ 2/9 Scores 08/01/2019 07/16/2018 07/03/2017 06/29/2016  PHQ - 2 Score 0 0 0 0  PHQ- 9 Score 0 0 0 -    Cognitive Function MMSE - Mini Mental State Exam 08/01/2019 07/16/2018 07/03/2017 06/29/2016  Orientation to time 5 5 5 5   Orientation to Place 5 5 5 5   Registration 3 3 3 3   Attention/ Calculation 5 0 0 0  Recall 3 3 3 3   Language- name 2 objects - 0 0 0  Language- repeat 1 1 1 1   Language- follow 3 step command - 0 3 3  Language- read & follow direction - 0 0 0  Write a sentence - 0 0 0  Copy design - 0 0 0  Total score - 17 20 20   Mini Cog  Mini-Cog screen was completed. Maximum score is 22. A value of 0 denotes this part of the MMSE was not completed or the patient failed this part of the Mini-Cog screening.       Immunization  History  Administered Date(s) Administered  . Influenza, High Dose Seasonal PF 01/11/2018  . Influenza,inj,Quad PF,6+ Mos 12/30/2016  . Moderna SARS-COVID-2 Vaccination 03/21/2019, 04/19/2019  . Pneumococcal Conjugate-13 06/06/2016  . Pneumococcal Polysaccharide-23 07/03/2017  . Td 07/07/2003, 07/12/2016    Qualifies for Shingles Vaccine: yes   Screening Tests Health Maintenance  Topic Date Due  . INFLUENZA VACCINE  09/29/2019  . COLONOSCOPY  04/12/2022  . TETANUS/TDAP  07/13/2026  . COVID-19 Vaccine  Completed  . Hepatitis C Screening  Completed  . PNA vac Low Risk Adult  Completed   Cancer Screenings: Lung: Low Dose CT Chest recommended if Age 56-80 years, 30 pack-year currently smoking OR have quit w/in 15 years. Patient does not qualify. Colorectal: completed 04/12/2017  Additional Screenings:  Hepatitis C Screening: 06/29/2016      Plan:   Patient will maintain and continue medications as prescribed.   I have personally reviewed and noted the following in the patient's chart:   . Medical and social history . Use of alcohol, tobacco or illicit drugs  . Current medications and supplements . Functional ability and status . Nutritional status . Physical activity . Advanced directives . List of other physicians . Hospitalizations, surgeries, and ER visits in previous 12 months . Vitals . Screenings to include cognitive, depression, and falls . Referrals and appointments  In addition, I have reviewed and discussed with patient certain preventive protocols, quality metrics, and best practice recommendations. A written personalized care plan for preventive services as well as general preventive health recommendations were provided to patient.     Andrez Grime, LPN  [id number]

## 2019-08-01 NOTE — Progress Notes (Signed)
PCP notes:  Health Maintenance: No gaps noted    Abnormal Screenings: none   Patient concerns: none   Nurse concerns: none   Next PCP appt: none 

## 2020-11-23 ENCOUNTER — Other Ambulatory Visit: Payer: Self-pay

## 2020-11-23 ENCOUNTER — Ambulatory Visit (INDEPENDENT_AMBULATORY_CARE_PROVIDER_SITE_OTHER): Payer: Medicare Other | Admitting: Family Medicine

## 2020-11-23 ENCOUNTER — Encounter: Payer: Self-pay | Admitting: Family Medicine

## 2020-11-23 VITALS — BP 155/88 | HR 57 | Temp 97.6°F | Ht 72.0 in | Wt 190.2 lb

## 2020-11-23 DIAGNOSIS — Z0000 Encounter for general adult medical examination without abnormal findings: Secondary | ICD-10-CM

## 2020-11-23 DIAGNOSIS — I700 Atherosclerosis of aorta: Secondary | ICD-10-CM

## 2020-11-23 DIAGNOSIS — R7303 Prediabetes: Secondary | ICD-10-CM | POA: Diagnosis not present

## 2020-11-23 DIAGNOSIS — Z125 Encounter for screening for malignant neoplasm of prostate: Secondary | ICD-10-CM | POA: Diagnosis not present

## 2020-11-23 DIAGNOSIS — I10 Essential (primary) hypertension: Secondary | ICD-10-CM | POA: Diagnosis not present

## 2020-11-23 DIAGNOSIS — I2510 Atherosclerotic heart disease of native coronary artery without angina pectoris: Secondary | ICD-10-CM

## 2020-11-23 DIAGNOSIS — E782 Mixed hyperlipidemia: Secondary | ICD-10-CM

## 2020-11-23 LAB — CBC WITH DIFFERENTIAL/PLATELET
Basophils Absolute: 0 10*3/uL (ref 0.0–0.1)
Basophils Relative: 0.6 % (ref 0.0–3.0)
Eosinophils Absolute: 0.1 10*3/uL (ref 0.0–0.7)
Eosinophils Relative: 2.2 % (ref 0.0–5.0)
HCT: 45.5 % (ref 39.0–52.0)
Hemoglobin: 15.1 g/dL (ref 13.0–17.0)
Lymphocytes Relative: 31.9 % (ref 12.0–46.0)
Lymphs Abs: 2 10*3/uL (ref 0.7–4.0)
MCHC: 33.2 g/dL (ref 30.0–36.0)
MCV: 89.8 fl (ref 78.0–100.0)
Monocytes Absolute: 0.4 10*3/uL (ref 0.1–1.0)
Monocytes Relative: 6.9 % (ref 3.0–12.0)
Neutro Abs: 3.7 10*3/uL (ref 1.4–7.7)
Neutrophils Relative %: 58.4 % (ref 43.0–77.0)
Platelets: 258 10*3/uL (ref 150.0–400.0)
RBC: 5.07 Mil/uL (ref 4.22–5.81)
RDW: 13.2 % (ref 11.5–15.5)
WBC: 6.3 10*3/uL (ref 4.0–10.5)

## 2020-11-23 LAB — COMPREHENSIVE METABOLIC PANEL
ALT: 25 U/L (ref 0–53)
AST: 19 U/L (ref 0–37)
Albumin: 4.5 g/dL (ref 3.5–5.2)
Alkaline Phosphatase: 103 U/L (ref 39–117)
BUN: 13 mg/dL (ref 6–23)
CO2: 29 mEq/L (ref 19–32)
Calcium: 9.9 mg/dL (ref 8.4–10.5)
Chloride: 100 mEq/L (ref 96–112)
Creatinine, Ser: 0.99 mg/dL (ref 0.40–1.50)
GFR: 76.84 mL/min (ref >60.00)
Glucose, Bld: 102 mg/dL — ABNORMAL HIGH (ref 70–99)
Potassium: 4.6 mEq/L (ref 3.5–5.1)
Sodium: 138 mEq/L (ref 135–145)
Total Bilirubin: 0.5 mg/dL (ref 0.2–1.2)
Total Protein: 7.7 g/dL (ref 6.0–8.3)

## 2020-11-23 LAB — HEMOGLOBIN A1C: Hgb A1c MFr Bld: 6.3 % (ref 4.6–6.5)

## 2020-11-23 LAB — TSH: TSH: 2.27 u[IU]/mL (ref 0.35–5.50)

## 2020-11-23 LAB — LIPID PANEL
Cholesterol: 311 mg/dL — ABNORMAL HIGH (ref 0–200)
HDL: 46.8 mg/dL (ref >39.00)
NonHDL: 264.65
Total CHOL/HDL Ratio: 7
Triglycerides: 322 mg/dL — ABNORMAL HIGH (ref 0.0–149.0)
VLDL: 64.4 mg/dL — ABNORMAL HIGH (ref 0.0–40.0)

## 2020-11-23 LAB — LDL CHOLESTEROL, DIRECT: Direct LDL: 211 mg/dL

## 2020-11-23 NOTE — Assessment & Plan Note (Signed)
Disc goals for lipids and reasons to control them Rev last labs with pt Rev low sat fat diet in detail Labs ordered  High LDL baseline/also not eating as well  Does not tolerate statins  Had cardiology visit in 2019-declined other meds and ? If pcyk9 would be approved

## 2020-11-23 NOTE — Assessment & Plan Note (Signed)
Reviewed health habits including diet and exercise and skin cancer prevention Reviewed appropriate screening tests for age  Also reviewed health mt list, fam hx and immunization status , as well as social and family history   See HPI Labs ordered Declines flu vaccine  Is covid vaccinated Interested in shingrix if it is affordable at the pharmacy and plans to check  No falls or fractures  utd colonoscopy in the setting of personal rectal cancer hx Good exercise Duanne Guess -enc to continue  Wt is stable  Advance directive is up to date  No cognitive concerns Nl hearing and vision screen  PHQ score of 0 No problems with ADLs

## 2020-11-23 NOTE — Patient Instructions (Addendum)
Think about use of a bike for knee problem   If you are interested in the new shingles vaccine (Shingrix) - call your local pharmacy to check on coverage and availability  If affordable, get on a wait list at your pharmacy to get the vaccine.  Avoid red meat/ fried foods/ egg yolks/ fatty breakfast meats/ butter, cheese and high fat dairy/ and shellfish    Keep watching your blood pressure at home  If above 140/90 please let us know    Take care of yourself  Stay active

## 2020-11-23 NOTE — Progress Notes (Signed)
Subjective:    Patient ID: [name], male    DOB: Jan 10, 1951, 70 y.o.   MRN: [id number]  This visit occurred during the SARS-CoV-2 public health emergency.  Safety protocols were in place, including screening questions prior to the visit, additional usage of staff PPE, and extensive cleaning of exam room while observing appropriate contact time as indicated for disinfecting solutions.   HPI Pt presents for amw and health mt exam  I have personally reviewed the Medicare Annual Wellness questionnaire and have noted 1. The patient's medical and social history 2. Their use of alcohol, tobacco or illicit drugs 3. Their current medications and supplements 4. The patient's functional ability including ADL's, fall risks, home safety risks and hearing or visual             impairment. 5. Diet and physical activities 6. Evidence for depression or mood disorders  The patients weight, height, BMI have been recorded in the chart and visual acuity is per eye clinic.  I have made referrals, counseling and provided education to the patient based review of the above and I have provided the pt with a written personalized care plan for preventive services. Reviewed and updated provider list, see scanned forms.  See scanned forms.  Routine anticipatory guidance given to patient.  See health maintenance. Colon cancer screening 2/19 colonoscopy  Personal history of rectal cancer Father and brother with colon cancer   Flu vaccine- declines  Tetanus vaccine Td 2018 Pneumovax-utd Covid vaccinated  Zoster vaccine-interested in shingrix if covered  Falls-none Fractures-none  Supplements-mvi (has D in it)  Exercise - golf and walking  (3-5 times per week 4 miles)  Stretches also (for knee)  [facility]    Prostate cancer screening Lab Results  Component Value Date   PSA 5.87 (H) 06/06/2016   PSA 3.57 06/14/2011   PSA 1.89 09/08/2008  Elevated in the past  Goes to urology (Dr Consuella Lose) on  a regular basis   No urinary changes  No nocturia at all     Due for labs today  Advance directive-up to date  Cognitive function addressed- see scanned forms- and if abnormal then additional documentation follows.   Some word finding -nothing else  No problems with concentration  Reads/can do finances    PMH and SH reviewed  Meds, vitals, and allergies reviewed.   ROS: See HPI.  Otherwise negative.    Weight : Wt Readings from Last 3 Encounters:  11/23/20 190 lb 4 oz (86.3 kg)  07/16/18 185 lb (83.9 kg)  07/03/17 191 lb 12 oz (87 kg)   25.80 kg/m 188 lb at home  185 to 190    Hearing/vision:  Hearing Screening   500Hz  1000Hz  2000Hz  4000Hz   Right ear 40 40 40 0  Left ear 40 40 40 0   Vision Screening   Right eye Left eye Both eyes  Without correction 20/25 20/25 20/25   With correction       Vision - readers/nothing else   PHQ: Depression screen [facility] 2/9 11/23/2020 08/01/2019 07/16/2018 07/03/2017 06/29/2016  Decreased Interest 0 0 0 0 0  Down, Depressed, Hopeless 0 0 0 0 0  PHQ - 2 Score 0 0 0 0 0  Altered sleeping - 0 0 0 -  Tired, decreased energy - 0 0 0 -  Change in appetite - 0 0 0 -  Feeling bad or failure about yourself  - 0 0 0 -  Trouble concentrating - 0  0 0 -  Moving slowly or fidgety/restless - 0 0 0 -  Suicidal thoughts - 0 0 0 -  PHQ-9 Score - 0 0 0 -  Difficult doing work/chores - Not difficult at all Not difficult at all Not difficult at all -     ADLs No problems   Functionality:-good/no problems  Knee problems did not keep him from doing anything  Care team  Kaden Daughdrill-pcp [facility]- cardiology [facility]- urology  Sees derm for screening  Does not always use sun protection   HTN-white coat bp is stable today  No cp or palpitations or headaches or edema  No side effects to medicines  BP Readings from Last 3 Encounters:  11/23/20 (!) 155/88  07/16/18 135/85  07/03/17 134/82    Prediabetes Lab Results  Component Value Date    HGBA1C 6.1 07/03/2017    Hyperlipidemia Lab Results  Component Value Date   CHOL 320 (H) 07/03/2017   HDL 49.90 07/03/2017   LDLDIRECT 216.0 07/03/2017   TRIG 309.0 (H) 07/03/2017   CHOLHDL 6 07/03/2017   Favorable ca index on CT  Intolerant to statins  Has seen cardiology and was not candidate fo pcyk9 inhibitor   Has eaten poorly recently  Has been eating sausage and hash browns and eggs  Does not want to change it  Thinks his LDL will be up  May consider [place] sausage    He does not worry about it  Runs in the family    Twisted his knee -pain for a while  (does have some OA in it)  Had an injection-helped  Can squat now   Patient Active Problem List   Diagnosis Date Noted   Medicare annual wellness visit, subsequent 11/23/2020   Coronary artery calcification seen on CT scan 03/19/2017   Aortic atherosclerosis ([place]) 03/19/2017   Essential hypertension 06/06/2016   Elevated PSA 06/16/2011   Routine general medical examination at a health care facility 06/16/2011   Prediabetes 06/14/2011   Prostate cancer screening 06/14/2011   Hyperlipidemia 09/08/2008   ADENOCARCINOMA, RECTUM 03/08/2007   NEPHROLITHIASIS, HX OF 03/08/2007   Past Medical History:  Diagnosis Date   Diverticulosis    Hyperlipidemia    Hypertension    borderline not on meds at this time   Nephrolithiasis    rectal ca dx'd 2005   rectal   Past Surgical History:  Procedure Laterality Date   COLONOSCOPY     enlarged prostate  08/2003   on CT scan   GANGLION CYST EXCISION Right 05/2016   GUM SURGERY  05/2018   cyst removal - benign   LOW ANTERIOR BOWEL RESECTION     Social History   Tobacco Use   Smoking status: Former    Types: Cigarettes   Smokeless tobacco: Never   Tobacco comments:    quit 23+years ago  Vaping Use   Vaping Use: Never used  Substance Use Topics   Alcohol use: Yes    Alcohol/week: 7.0 standard drinks    Types: 3 Glasses of wine, 4 Standard drinks or equivalent  per week    Comment: daily   Drug use: No   Family History  Problem Relation Age of Onset   Cancer Father 34       colon   Colon cancer Father    Colon polyps Father    Cancer Brother        colon   Colon cancer Brother    Colon polyps Brother    Esophageal cancer  Neg Hx    Rectal cancer Neg Hx    Stomach cancer Neg Hx    Allergies  Allergen Reactions   Atorvastatin Other (See Comments)    REACTION: elevated transamines   Pravastatin Sodium Other (See Comments)    REACTION: mood problems/irritability/personality change   Current Outpatient Medications on File Prior to Visit  Medication Sig Dispense Refill   Multiple Vitamin (MULTIVITAMIN) tablet Take 1 tablet by mouth daily.     naproxen sodium (ALEVE) 220 MG tablet Take 220 mg by mouth daily as needed.     No current facility-administered medications on file prior to visit.    Review of Systems  Constitutional:  Negative for chills, fever and malaise/fatigue.  HENT:  Negative for congestion, ear pain, sinus pain and sore throat.   Eyes:  Negative for blurred vision, discharge and redness.  Respiratory:  Negative for cough, shortness of breath and stridor.   Cardiovascular:  Negative for chest pain, palpitations and leg swelling.  Gastrointestinal:  Negative for abdominal pain, diarrhea, nausea and vomiting.  Musculoskeletal:  Negative for myalgias.  Skin:  Negative for rash.  Neurological:  Negative for dizziness and headaches.  Review of Systems  Constitutional:  Negative for chills, fever and malaise/fatigue.  HENT:  Negative for congestion, ear pain, sinus pain and sore throat.   Eyes:  Negative for blurred vision, discharge and redness.  Respiratory:  Negative for cough, shortness of breath and stridor.   Cardiovascular:  Negative for chest pain, palpitations and leg swelling.  Gastrointestinal:  Negative for abdominal pain, diarrhea, nausea and vomiting.  Musculoskeletal:  Negative for myalgias.  Skin:   Negative for rash.  Neurological:  Negative for dizziness and headaches.      Objective:   Physical Exam Constitutional:      General: He is not in acute distress.    Appearance: Normal appearance. He is well-developed and normal weight. He is not ill-appearing or diaphoretic.  HENT:     Head: Normocephalic and atraumatic.     Right Ear: Tympanic membrane, ear canal and external ear normal.     Left Ear: Tympanic membrane, ear canal and external ear normal.     Nose: Nose normal. No congestion.     Mouth/Throat:     Mouth: Mucous membranes are moist.     Pharynx: Oropharynx is clear. No posterior oropharyngeal erythema.  Eyes:     General: No scleral icterus.       Right eye: No discharge.        Left eye: No discharge.     Conjunctiva/sclera: Conjunctivae normal.     Pupils: Pupils are equal, round, and reactive to light.  Neck:     Thyroid: No thyromegaly.     Vascular: No carotid bruit or JVD.  Cardiovascular:     Rate and Rhythm: Normal rate and regular rhythm.     Pulses: Normal pulses.     Heart sounds: Normal heart sounds.    No gallop.  Pulmonary:     Effort: Pulmonary effort is normal. No respiratory distress.     Breath sounds: Normal breath sounds. No wheezing or rales.     Comments: Good air exch Chest:     Chest wall: No tenderness.  Abdominal:     General: Bowel sounds are normal. There is no distension or abdominal bruit.     Palpations: Abdomen is soft. There is no mass.     Tenderness: There is no abdominal tenderness.     Hernia:  No hernia is present.  Musculoskeletal:        General: No tenderness.     Cervical back: Normal range of motion and neck supple. No rigidity. No muscular tenderness.     Right lower leg: No edema.     Left lower leg: No edema.     Comments: No kyphosis or acute joint change  Lymphadenopathy:     Cervical: No cervical adenopathy.  Skin:    General: Skin is warm and dry.     Coloration: Skin is not pale.     Findings: No  erythema or rash.     Comments: Solar lentigines diffusely Few sks on back    Neurological:     Mental Status: He is alert.     Cranial Nerves: No cranial nerve deficit.     Motor: No abnormal muscle tone.     Coordination: Coordination normal.     Gait: Gait normal.     Deep Tendon Reflexes: Reflexes are normal and symmetric. Reflexes normal.  Psychiatric:        Mood and Affect: Mood normal.        Cognition and Memory: Cognition and memory normal.     Comments: Mentally sharp          Assessment & Plan:   Problem List Items Addressed This Visit       Cardiovascular and Mediastinum   Essential hypertension    White coat syndrome  BP: (!) 155/88    Per pt avg bp at home is 130/80  No symptoms Adv her continue to watch this closely and keep [place] updated       Relevant Orders   CBC with Differential/Platelet   Comprehensive metabolic panel   Lipid panel   TSH   Coronary artery calcification seen on CT scan    Mild 3 vessel Saw cardiology/Dr Rockey Situ 2019       Aortic atherosclerosis ([place])    Aware, has seen cardiology and statin intolerant  bp is good at home He would rather not be on medication         Other   Hyperlipidemia    Disc goals for lipids and reasons to control them Rev last labs with pt Rev low sat fat diet in detail Labs ordered  High LDL baseline/also not eating as well  Does not tolerate statins  Had cardiology visit in 2019-declined other meds and ? If pcyk9 would be approved       Relevant Orders   Lipid panel   Prediabetes    A1C done today  Per pt -diet not as good recently   disc imp of low glycemic diet and wt loss to prevent DM2       Relevant Orders   Hemoglobin A1c   Prostate cancer screening    Per pt- is utd with prostate care with Dr Rockey Situ and his last psa was good  No voiding problems H/o BPH on exam        Routine general medical examination at a health care facility - Primary    Reviewed health habits  including diet and exercise and skin cancer prevention Reviewed appropriate screening tests for age  Also reviewed health mt list, fam hx and immunization status , as well as social and family history   See HPI Labs ordered Declines flu vaccine  Is covid vaccinated Interested in shingrix if it is affordable at the pharmacy and plans to check  No falls or fractures  utd colonoscopy  in the setting of personal rectal cancer hx Good exercise /walking -enc to continue  Wt is stable  Advance directive is up to date  No cognitive concerns Nl hearing and vision screen  PHQ score of 0 No problems with ADLs       Medicare annual wellness visit, subsequent    Reviewed health habits including diet and exercise and skin cancer prevention Reviewed appropriate screening tests for age  Also reviewed health mt list, fam hx and immunization status , as well as social and family history   See HPI Labs ordered Declines flu vaccine  Is covid vaccinated Interested in shingrix if it is affordable at the pharmacy and plans to check  No falls or fractures  utd colonoscopy in the setting of personal rectal cancer hx Good exercise Duanne Guess -enc to continue  Wt is stable  Advance directive is up to date  No cognitive concerns Nl hearing and vision screen  PHQ score of 0 No problems with ADLs

## 2020-11-23 NOTE — Assessment & Plan Note (Signed)
Mild 3 vessel Saw cardiology/Dr Rockey Situ 2019

## 2020-11-23 NOTE — Assessment & Plan Note (Signed)
Aware, has seen cardiology and statin intolerant  bp is good at home He would rather not be on medication

## 2020-11-23 NOTE — Assessment & Plan Note (Signed)
Per pt- is utd with prostate care with Dr Rockey Situ and his last psa was good  No voiding problems H/o BPH on exam

## 2020-11-23 NOTE — Assessment & Plan Note (Signed)
White coat syndrome  BP: (!) 155/88    Per pt avg bp at home is 130/80  No symptoms Adv her continue to watch this closely and keep [place] updated

## 2020-11-23 NOTE — Assessment & Plan Note (Signed)
A1C done today  Per pt -diet not as good recently   disc imp of low glycemic diet and wt loss to prevent DM2

## 2020-11-24 ENCOUNTER — Encounter: Payer: Medicare Other | Admitting: Family Medicine

## 2020-12-14 ENCOUNTER — Telehealth: Payer: Self-pay | Admitting: *Deleted

## 2020-12-14 NOTE — Telephone Encounter (Signed)
-----   Message from Abner Greenspan, MD sent at 12/10/2020  6:28 PM EDT ----- Please let pt know it looks like his last urology visit was 2019 and he needs to follow up with them.  If a referral is needed, let me know ----- Message ----- From: Tammi Sou, CMA Sent: 12/10/2020  10:42 AM EDT To: Abner Greenspan, MD   ----- Message ----- From: Leighton Parody Sent: 12/10/2020  10:16 AM EDT To: Tammi Sou, CMA

## 2020-12-14 NOTE — Telephone Encounter (Signed)
Pt notified of Dr. Marliss Coots comments he will call urologist and get an appt but if they say he needs a referral he will call us back and let us know

## 2021-01-27 ENCOUNTER — Telehealth: Payer: Self-pay | Admitting: Family Medicine

## 2021-01-27 NOTE — Telephone Encounter (Signed)
Chart updated

## 2021-01-27 NOTE — Telephone Encounter (Signed)
Pt called in to updated is record . Got his 2nd shingles shot  01/27/21 and up to date  Covid booster on 12/21/20

## 2021-04-14 ENCOUNTER — Encounter: Payer: Self-pay | Admitting: Family Medicine

## 2022-03-16 ENCOUNTER — Encounter: Payer: Self-pay | Admitting: Internal Medicine

## 2022-04-04 ENCOUNTER — Telehealth: Payer: Self-pay | Admitting: Family Medicine

## 2022-04-04 DIAGNOSIS — I10 Essential (primary) hypertension: Secondary | ICD-10-CM

## 2022-04-04 DIAGNOSIS — E782 Mixed hyperlipidemia: Secondary | ICD-10-CM

## 2022-04-04 DIAGNOSIS — R7303 Prediabetes: Secondary | ICD-10-CM

## 2022-04-04 DIAGNOSIS — R972 Elevated prostate specific antigen [PSA]: Secondary | ICD-10-CM

## 2022-04-04 NOTE — Telephone Encounter (Signed)
Please place lab orders and I can released them into [facility]

## 2022-04-04 NOTE — Telephone Encounter (Signed)
I ordered them

## 2022-04-04 NOTE — Telephone Encounter (Signed)
Patient would like cpe labs done at [facility] in [place]. Would like orders placed. CPE is on 05/10/22

## 2022-04-05 NOTE — Telephone Encounter (Signed)
Orders released into [facility] and paper order mailed to pt

## 2022-05-06 LAB — CBC WITH DIFFERENTIAL/PLATELET
Basophils Absolute: 0.1 10*3/uL (ref 0.0–0.2)
Basos: 1 %
EOS (ABSOLUTE): 0.1 10*3/uL (ref 0.0–0.4)
Eos: 2 %
Hematocrit: 47.3 % (ref 37.5–51.0)
Hemoglobin: 15.3 g/dL (ref 13.0–17.7)
Immature Grans (Abs): 0 10*3/uL (ref 0.0–0.1)
Immature Granulocytes: 0 %
Lymphocytes Absolute: 1.6 10*3/uL (ref 0.7–3.1)
Lymphs: 26 %
MCH: 28.4 pg (ref 26.6–33.0)
MCHC: 32.3 g/dL (ref 31.5–35.7)
MCV: 88 fL (ref 79–97)
Monocytes Absolute: 0.3 10*3/uL (ref 0.1–0.9)
Monocytes: 5 %
Neutrophils Absolute: 4.1 10*3/uL (ref 1.4–7.0)
Neutrophils: 66 %
Platelets: 297 10*3/uL (ref 150–450)
RBC: 5.38 x10E6/uL (ref 4.14–5.80)
RDW: 13 % (ref 11.6–15.4)
WBC: 6.2 10*3/uL (ref 3.4–10.8)

## 2022-05-06 LAB — HEMOGLOBIN A1C
Est. average glucose Bld gHb Est-mCnc: 146 mg/dL
Hgb A1c MFr Bld: 6.7 % — ABNORMAL HIGH (ref 4.8–5.6)

## 2022-05-06 LAB — COMPREHENSIVE METABOLIC PANEL
ALT: 25 IU/L (ref 0–44)
AST: 20 IU/L (ref 0–40)
Albumin/Globulin Ratio: 1.5 (ref 1.2–2.2)
Albumin: 4.4 g/dL (ref 3.8–4.8)
Alkaline Phosphatase: 123 IU/L — ABNORMAL HIGH (ref 44–121)
BUN/Creatinine Ratio: 8 — ABNORMAL LOW (ref 10–24)
BUN: 9 mg/dL (ref 8–27)
Bilirubin Total: 0.3 mg/dL (ref 0.0–1.2)
CO2: 24 mmol/L (ref 20–29)
Calcium: 10 mg/dL (ref 8.6–10.2)
Chloride: 99 mmol/L (ref 96–106)
Creatinine, Ser: 1.08 mg/dL (ref 0.76–1.27)
Globulin, Total: 3 g/dL (ref 1.5–4.5)
Glucose: 226 mg/dL — ABNORMAL HIGH (ref 70–99)
Potassium: 4.5 mmol/L (ref 3.5–5.2)
Sodium: 135 mmol/L (ref 134–144)
Total Protein: 7.4 g/dL (ref 6.0–8.5)
eGFR: 73 mL/min/{1.73_m2} (ref >59)

## 2022-05-06 LAB — LIPID PANEL
Chol/HDL Ratio: 6.8 ratio — ABNORMAL HIGH (ref 0.0–5.0)
Cholesterol, Total: 315 mg/dL — ABNORMAL HIGH (ref 100–199)
HDL: 46 mg/dL (ref >39)
LDL Chol Calc (NIH): 212 mg/dL — ABNORMAL HIGH (ref 0–99)
Triglycerides: 283 mg/dL — ABNORMAL HIGH (ref 0–149)
VLDL Cholesterol Cal: 57 mg/dL — ABNORMAL HIGH (ref 5–40)

## 2022-05-06 LAB — TSH: TSH: 1.63 u[IU]/mL (ref 0.450–4.500)

## 2022-05-10 ENCOUNTER — Ambulatory Visit (INDEPENDENT_AMBULATORY_CARE_PROVIDER_SITE_OTHER): Payer: Medicare Other

## 2022-05-10 VITALS — Ht 72.0 in | Wt 188.0 lb

## 2022-05-10 DIAGNOSIS — Z0000 Encounter for general adult medical examination without abnormal findings: Secondary | ICD-10-CM | POA: Diagnosis not present

## 2022-05-10 DIAGNOSIS — Z1211 Encounter for screening for malignant neoplasm of colon: Secondary | ICD-10-CM

## 2022-05-10 NOTE — Patient Instructions (Signed)
[name] , Thank you for taking time to come for your Medicare Wellness Visit. I appreciate your ongoing commitment to your health goals. Please review the following plan we discussed and let me know if I can assist you in the future.   These are the goals we discussed:  Goals      Patient Stated     Starting 07/16/2018, I will continue to exercise for at least 60 minutes daily.      Patient Stated     08/01/2019, I will continue to walk 2 miles 2-3 times a week.      Patient Stated     Get in better condition, be more active.        This is a list of the screening recommended for you and due dates:  Health Maintenance  Topic Date Due   Flu Shot  09/28/2021   COVID-19 Vaccine (5 - 2023-24 season) 10/29/2021   Colon Cancer Screening  04/12/2022   Medicare Annual Wellness Visit  05/10/2023   DTaP/Tdap/Td vaccine (3 - Tdap) 07/13/2026   Pneumonia Vaccine  Completed   Hepatitis C Screening: USPSTF Recommendation to screen - Ages 80-79 yo.  Completed   Zoster (Shingles) Vaccine  Completed   HPV Vaccine  Aged Out    Advanced directives: Please bring a copy of your health care power of attorney and living will to the office to be added to your chart at your convenience.   Conditions/risks identified: Aim for 30 minutes of exercise or brisk walking, 6-8 glasses of water, and 5 servings of fruits and vegetables each day.   Next appointment: Follow up in one year for your annual wellness visit. 05/15/23 @ 2:00 via telephone.  Preventive Care 52 Years and Older, Male  Preventive care refers to lifestyle choices and visits with your health care provider that can promote health and wellness. What does preventive care include? A yearly physical exam. This is also called an annual well check. Dental exams once or twice a year. Routine eye exams. Ask your health care provider how often you should have your eyes checked. Personal lifestyle choices, including: Daily care of your teeth and  gums. Regular physical activity. Eating a healthy diet. Avoiding tobacco and drug use. Limiting alcohol use. Practicing safe sex. Taking low doses of aspirin every day. Taking vitamin and mineral supplements as recommended by your health care provider. What happens during an annual well check? The services and screenings done by your health care provider during your annual well check will depend on your age, overall health, lifestyle risk factors, and family history of disease. Counseling  Your health care provider may ask you questions about your: Alcohol use. Tobacco use. Drug use. Emotional well-being. Home and relationship well-being. Sexual activity. Eating habits. History of falls. Memory and ability to understand (cognition). Work and work Statistician. Screening  You may have the following tests or measurements: Height, weight, and BMI. Blood pressure. Lipid and cholesterol levels. These may be checked every 5 years, or more frequently if you are over 79 years old. Skin check. Lung cancer screening. You may have this screening every year starting at age 73 if you have a 30-pack-year history of smoking and currently smoke or have quit within the past 15 years. Fecal occult blood test (FOBT) of the stool. You may have this test every year starting at age 73. Flexible sigmoidoscopy or colonoscopy. You may have a sigmoidoscopy every 5 years or a colonoscopy every 10 years starting at  age 73. Prostate cancer screening. Recommendations will vary depending on your family history and other risks. Hepatitis C blood test. Hepatitis B blood test. Sexually transmitted disease (STD) testing. Diabetes screening. This is done by checking your blood sugar (glucose) after you have not eaten for a while (fasting). You may have this done every 1-3 years. Abdominal aortic aneurysm (AAA) screening. You may need this if you are a current or former smoker. Osteoporosis. You may be screened  starting at age 73 if you are at high risk. Talk with your health care provider about your test results, treatment options, and if necessary, the need for more tests. Vaccines  Your health care provider may recommend certain vaccines, such as: Influenza vaccine. This is recommended every year. Tetanus, diphtheria, and acellular pertussis (Tdap, Td) vaccine. You may need a Td booster every 10 years. Zoster vaccine. You may need this after age 73. Pneumococcal 13-valent conjugate (PCV13) vaccine. One dose is recommended after age 73. Pneumococcal polysaccharide (PPSV23) vaccine. One dose is recommended after age 73. Talk to your health care provider about which screenings and vaccines you need and how often you need them. This information is not intended to replace advice given to you by your health care provider. Make sure you discuss any questions you have with your health care provider. Document Released: 03/13/2015 Document Revised: 11/04/2015 Document Reviewed: 12/16/2014 Elsevier Interactive Patient Education  2017 [place] Prevention in the Home Falls can cause injuries. They can happen to people of all ages. There are many things you can do to make your home safe and to help prevent falls. What can I do on the outside of my home? Regularly fix the edges of walkways and driveways and fix any cracks. Remove anything that might make you trip as you walk through a door, such as a raised step or threshold. Trim any bushes or trees on the path to your home. Use bright outdoor lighting. Clear any walking paths of anything that might make someone trip, such as rocks or tools. Regularly check to see if handrails are loose or broken. Make sure that both sides of any steps have handrails. Any raised decks and porches should have guardrails on the edges. Have any leaves, snow, or ice cleared regularly. Use sand or salt on walking paths during winter. Clean up any spills in your garage  right away. This includes oil or grease spills. What can I do in the bathroom? Use night lights. Install grab bars by the toilet and in the tub and shower. Do not use towel bars as grab bars. Use non-skid mats or decals in the tub or shower. If you need to sit down in the shower, use a plastic, non-slip stool. Keep the floor dry. Clean up any water that spills on the floor as soon as it happens. Remove soap buildup in the tub or shower regularly. Attach bath mats securely with double-sided non-slip rug tape. Do not have throw rugs and other things on the floor that can make you trip. What can I do in the bedroom? Use night lights. Make sure that you have a light by your bed that is easy to reach. Do not use any sheets or blankets that are too big for your bed. They should not hang down onto the floor. Have a firm chair that has side arms. You can use this for support while you get dressed. Do not have throw rugs and other things on the floor that can make  you trip. What can I do in the kitchen? Clean up any spills right away. Avoid walking on wet floors. Keep items that you use a lot in easy-to-reach places. If you need to reach something above you, use a strong step stool that has a grab bar. Keep electrical cords out of the way. Do not use floor polish or wax that makes floors slippery. If you must use wax, use non-skid floor wax. Do not have throw rugs and other things on the floor that can make you trip. What can I do with my stairs? Do not leave any items on the stairs. Make sure that there are handrails on both sides of the stairs and use them. Fix handrails that are broken or loose. Make sure that handrails are as long as the stairways. Check any carpeting to make sure that it is firmly attached to the stairs. Fix any carpet that is loose or worn. Avoid having throw rugs at the top or bottom of the stairs. If you do have throw rugs, attach them to the floor with carpet tape. Make  sure that you have a light switch at the top of the stairs and the bottom of the stairs. If you do not have them, ask someone to add them for you. What else can I do to help prevent falls? Wear shoes that: Do not have high heels. Have rubber bottoms. Are comfortable and fit you well. Are closed at the toe. Do not wear sandals. If you use a stepladder: Make sure that it is fully opened. Do not climb a closed stepladder. Make sure that both sides of the stepladder are locked into place. Ask someone to hold it for you, if possible. Clearly mark and make sure that you can see: Any grab bars or handrails. First and last steps. Where the edge of each step is. Use tools that help you move around (mobility aids) if they are needed. These include: Canes. Walkers. Scooters. Crutches. Turn on the lights when you go into a dark area. Replace any light bulbs as soon as they burn out. Set up your furniture so you have a clear path. Avoid moving your furniture around. If any of your floors are uneven, fix them. If there are any pets around you, be aware of where they are. Review your medicines with your doctor. Some medicines can make you feel dizzy. This can increase your chance of falling. Ask your doctor what other things that you can do to help prevent falls. This information is not intended to replace advice given to you by your health care provider. Make sure you discuss any questions you have with your health care provider. Document Released: 12/11/2008 Document Revised: 07/23/2015 Document Reviewed: 03/21/2014 Elsevier Interactive Patient Education  2017 [employer].

## 2022-05-10 NOTE — Progress Notes (Signed)
I connected with  [name] on 05/10/22 by a audio enabled telemedicine application and verified that I am speaking with the correct person using two identifiers.  Patient Location: Home  Provider Location: Home Office  I discussed the limitations of evaluation and management by telemedicine. The patient expressed understanding and agreed to proceed.  Subjective:   [name] is a 72 y.o. male who presents for Medicare Annual/Subsequent preventive examination.  Review of Systems     Cardiac Risk Factors include: advanced age (>31mn, >>76women);hypertension;male gender     Objective:    Today's Vitals   05/10/22 1404  Weight: 188 lb (85.3 kg)  Height: 6' (1.829 m)   Body mass index is 25.5 kg/m.     05/10/2022    2:23 PM 08/01/2019    3:29 PM 07/16/2018    8:09 AM 07/03/2017    9:41 AM 04/12/2017    7:14 AM 11/15/2016    1:41 AM 06/29/2016    2:48 PM  Advanced Directives  Does Patient Have a Medical Advance Directive? Yes Yes Yes Yes Yes No Yes  Type of AParamedicof [place]ichton ParkLiving will HHaz[place] ParkLiving will HH[place]Living will;Out of facility DNR (pink MOST or yellow form) HMount [place]Living will Healthcare Power of [place]Living will  Copy of [place] Chart? No - copy requested No - copy requested No - copy requested No - copy requested   No - copy requested    Current Medications (verified) Outpatient Encounter Medications as of 05/10/2022  Medication Sig   Multiple Vitamin (MULTIVITAMIN) tablet Take 1 tablet by mouth daily.   naproxen sodium (ALEVE) 220 MG tablet Take 220 mg by mouth daily as needed.   No facility-administered encounter medications on file as of 05/10/2022.    Allergies (verified) Atorvastatin and Pravastatin sodium   History: Past Medical History:  Diagnosis Date   Diverticulosis    Hyperlipidemia     Hypertension    borderline not on meds at this time   Nephrolithiasis    rectal ca dx'd 2005   rectal   Past Surgical History:  Procedure Laterality Date   COLONOSCOPY     enlarged prostate  08/2003   on CT scan   GANGLION CYST EXCISION Right 05/2016   GUM SURGERY  05/2018   cyst removal - benign   LOW ANTERIOR BOWEL RESECTION     Family History  Problem Relation Age of Onset   Cancer Father 440      colon   Colon cancer Father    Colon polyps Father    Cancer Brother        colon   Colon cancer Brother    Colon polyps Brother    Esophageal cancer Neg Hx    Rectal cancer Neg Hx    Stomach cancer Neg Hx    Social History   Socioeconomic History   Marital status: Married    Spouse name: Not on file   Number of children: Not on file   Years of education: Not on file   Highest education level: Not on file  Occupational History   Occupation: DQuarry manager   Employer: [employer] Tobacco Use   Smoking status: Former    Types: Cigarettes   Smokeless tobacco: Never   Tobacco comments:    quit 23+years ago  Vaping Use   Vaping Use: Never used  Substance and Sexual  Activity   Alcohol use: Yes    Alcohol/week: 7.0 standard drinks of alcohol    Types: 3 Glasses of wine, 4 Standard drinks or equivalent per week    Comment: daily   Drug use: No   Sexual activity: Not Currently  Other Topics Concern   Not on file  Social History Narrative   Not on file   Social Determinants of Health   Financial Resource Strain: Low Risk  (05/10/2022)   Overall Financial Resource Strain (CARDIA)    Difficulty of Paying Living Expenses: Not hard at all  Food Insecurity: No Food Insecurity (05/10/2022)   Hunger Vital Sign    Worried About Running Out of Food in the Last Year: Never true    Ran Out of Food in the Last Year: Never true  Transportation Needs: No Transportation Needs (05/10/2022)   PRAPARE - Hydrologist (Medical): No    Lack of  Transportation (Non-Medical): No  Physical Activity: Insufficiently Active (05/10/2022)   Exercise Vital Sign    Days of Exercise per Week: 2 days    Minutes of Exercise per Session: 60 min  Stress: No Stress Concern Present (05/10/2022)   [place]    Feeling of Stress : Not at all  Social Connections: Moderately Integrated (05/10/2022)   Social Connection and Isolation Panel [NHANES]    Frequency of Communication with Friends and Family: Once a week    Frequency of Social Gatherings with Friends and Family: Three times a week    Attends Religious Services: Never    Active Member of Clubs or Organizations: Yes    Attends Archivist Meetings: Never    Marital Status: Married    Tobacco Counseling Counseling given: Not Answered Tobacco comments: quit 23+years ago   Clinical Intake:  Pre-visit preparation completed: Yes  Pain : No/denies pain     Nutritional Risks: Nausea/ vomitting/ diarrhea (occasional loose stools) Diabetes: No  How often do you need to have someone help you when you read instructions, pamphlets, or other written materials from your doctor or pharmacy?: 1 - Never  Diabetic? no  Interpreter Needed?: No  Information entered by :: William Vandermeulen LPN   Activities of Daily Living    05/10/2022    2:23 PM  In your present state of health, do you have any difficulty performing the following activities:  Hearing? 0  Vision? 0  Difficulty concentrating or making decisions? 0  Walking or climbing stairs? 0  Dressing or bathing? 0  Doing errands, shopping? 0  Preparing Food and eating ? N  Using the Toilet? N  In the past six months, have you accidently leaked urine? N  Do you have problems with loss of bowel control? N  Managing your Medications? N  Managing your Finances? N  Housekeeping or managing your Housekeeping? N    Patient Care Team: Tower, Wynelle Fanny, MD as PCP -  General  Indicate any recent Medical Services you may have received from other than [facility] providers in the past year (date may be approximate).     Assessment:   This is a routine wellness examination for [name].  Hearing/Vision screen Hearing Screening - Comments:: No aids Vision Screening - Comments:: Wears readers - doe not see eye doctor regularly  Dietary issues and exercise activities discussed: Current Exercise Habits: The patient does not participate in regular exercise at present, Exercise limited by: None identified   Goals Addressed  This Visit's Progress    Patient Stated       Get in better condition, be more active.       Depression Screen    05/10/2022    2:20 PM 11/23/2020   10:51 AM 08/01/2019    3:30 PM 07/16/2018    8:09 AM 07/03/2017    9:42 AM 06/29/2016    2:48 PM  PHQ 2/9 Scores  PHQ - 2 Score 0 0 0 0 0 0  PHQ- 9 Score   0 0 0     Fall Risk    05/10/2022    2:14 PM 11/23/2020   10:59 AM 11/23/2020   10:51 AM 08/01/2019    3:30 PM 07/16/2018    8:09 AM  Fall Risk   Falls in the past year? 1 0 1 0 0  Number falls in past yr: 0 0 0 0   Injury with Fall? 1 0 1 0   Comment fell stepping off ladder landed on shoulder.      Risk for fall due to : No Fall Risks   No Fall Risks   Follow up Falls prevention discussed;Falls evaluation completed Falls evaluation completed Falls evaluation completed Falls evaluation completed;Falls prevention discussed     FALL RISK PREVENTION PERTAINING TO THE HOME:  Any stairs in or around the home? Yes  If so, are there any without handrails? No  Home free of loose throw rugs in walkways, pet beds, electrical cords, etc? Yes  Adequate lighting in your home to reduce risk of falls? Yes   ASSISTIVE DEVICES UTILIZED TO PREVENT FALLS:  Life alert? No  Use of a cane, walker or w/c? No  Grab bars in the bathroom? No  Shower chair or bench in shower? Yes  Elevated toilet seat or a handicapped toilet? No     Cognitive Function:    08/01/2019    3:31 PM 07/16/2018    8:09 AM 07/03/2017    9:42 AM 06/29/2016    2:52 PM  MMSE - Mini Mental State Exam  Orientation to time '5 5 5 5  '$ Orientation to Place '5 5 5 5  '$ Registration '3 3 3 3  '$ Attention/ Calculation 5 0 0 0  Recall '3 3 3 3  '$ Language- name 2 objects  0 0 0  Language- repeat '1 1 1 1  '$ Language- follow 3 step command  0 3 3  Language- read & follow direction  0 0 0  Write a sentence  0 0 0  Copy design  0 0 0  Total score  '17 20 20        '$ 05/10/2022    2:24 PM  6CIT Screen  What Year? 0 points  What month? 0 points  What time? 0 points  Count back from 20 0 points  Months in reverse 0 points  Repeat phrase 0 points  Total Score 0 points    Immunizations Immunization History  Administered Date(s) Administered   Influenza, High Dose Seasonal PF 01/11/2018   Influenza,inj,Quad PF,6+ Mos 12/30/2016   Moderna Covid-19 Vaccine Bivalent Booster 91yr & up 12/21/2020   Moderna Sars-Covid-2 Vaccination 03/21/2019, 04/19/2019, 12/23/2019   Pneumococcal Conjugate-13 06/06/2016   Pneumococcal Polysaccharide-23 07/03/2017   Td 07/07/2003, 07/12/2016   Zoster Recombinat (Shingrix) 11/24/2020, 01/27/2021    TDAP status: Up to date  Flu Vaccine status: Declined, Education has been provided regarding the importance of this vaccine but patient still declined. Advised may receive this vaccine at local pharmacy or  Health Dept. Aware to provide a copy of the vaccination record if obtained from local pharmacy or Health Dept. Verbalized acceptance and understanding.  Pneumococcal vaccine status: Up to date  Covid-19 vaccine status: Information provided on how to obtain vaccines.   Qualifies for Shingles Vaccine? Yes   Zostavax completed  unknown   Shingrix Completed?: Yes  Screening Tests Health Maintenance  Topic Date Due   INFLUENZA VACCINE  09/28/2021   COVID-19 Vaccine (5 - 2023-24 season) 10/29/2021   COLONOSCOPY (Pts 45-36yr  Insurance coverage will need to be confirmed)  04/12/2022   Medicare Annual Wellness (AWV)  05/10/2023   DTaP/Tdap/Td (3 - Tdap) 07/13/2026   Pneumonia Vaccine 72 Years old  Completed   Hepatitis C Screening  Completed   Zoster Vaccines- Shingrix  Completed   HPV VACCINES  Aged Out    Health Maintenance  Health Maintenance Due  Topic Date Due   INFLUENZA VACCINE  09/28/2021   COVID-19 Vaccine (5 - 2023-24 season) 10/29/2021   COLONOSCOPY (Pts 45-460yrInsurance coverage will need to be confirmed)  04/12/2022    Colorectal cancer screening: Referral to GI placed 05/10/2022. Pt aware the office will call re: appt.  Lung Cancer Screening: (Low Dose CT Chest recommended if Age 73-80ears, 30 pack-year currently smoking OR have quit w/in 15years.) does not qualify.   Lung Cancer Screening Referral: no  Additional Screening:  Hepatitis C Screening: does not qualify; Completed 06/29/2016  Vision Screening: Recommended annual ophthalmology exams for early detection of glaucoma and other disorders of the eye. Is the patient up to date with their annual eye exam?  No  Who is the provider or what is the name of the office in which the patient attends annual eye exams? none If pt is not established with a provider, would they like to be referred to a provider to establish care?  no .   Dental Screening: Recommended annual dental exams for proper oral hygiene  Community Resource Referral / Chronic Care Management: CRR required this visit?  No   CCM required this visit?  No      Plan:     I have personally reviewed and noted the following in the patient's chart:   Medical and social history Use of alcohol, tobacco or illicit drugs  Current medications and supplements including opioid prescriptions. Patient is not currently taking opioid prescriptions. Functional ability and status Nutritional status Physical activity Advanced directives List of other  physicians Hospitalizations, surgeries, and ER visits in previous 12 months Vitals Screenings to include cognitive, depression, and falls Referrals and appointments  In addition, I have reviewed and discussed with patient certain preventive protocols, quality metrics, and best practice recommendations. A written personalized care plan for preventive services as well as general preventive health recommendations were provided to patient.     William ConnersLPN   [id number] Nurse Notes: Colonoscopy order placed.

## 2022-05-12 ENCOUNTER — Encounter: Payer: Self-pay | Admitting: Family Medicine

## 2022-05-12 ENCOUNTER — Encounter: Payer: Self-pay | Admitting: Internal Medicine

## 2022-05-12 ENCOUNTER — Ambulatory Visit (INDEPENDENT_AMBULATORY_CARE_PROVIDER_SITE_OTHER): Payer: Medicare Other | Admitting: Family Medicine

## 2022-05-12 VITALS — BP 188/92 | HR 95 | Temp 98.0°F | Ht 71.5 in | Wt 190.1 lb

## 2022-05-12 DIAGNOSIS — R7303 Prediabetes: Secondary | ICD-10-CM

## 2022-05-12 DIAGNOSIS — Z0001 Encounter for general adult medical examination with abnormal findings: Secondary | ICD-10-CM

## 2022-05-12 DIAGNOSIS — I700 Atherosclerosis of aorta: Secondary | ICD-10-CM | POA: Diagnosis not present

## 2022-05-12 DIAGNOSIS — Z125 Encounter for screening for malignant neoplasm of prostate: Secondary | ICD-10-CM | POA: Diagnosis not present

## 2022-05-12 DIAGNOSIS — I10 Essential (primary) hypertension: Secondary | ICD-10-CM

## 2022-05-12 DIAGNOSIS — H9313 Tinnitus, bilateral: Secondary | ICD-10-CM

## 2022-05-12 DIAGNOSIS — H9319 Tinnitus, unspecified ear: Secondary | ICD-10-CM | POA: Insufficient documentation

## 2022-05-12 DIAGNOSIS — I2510 Atherosclerotic heart disease of native coronary artery without angina pectoris: Secondary | ICD-10-CM | POA: Diagnosis not present

## 2022-05-12 DIAGNOSIS — H81399 Other peripheral vertigo, unspecified ear: Secondary | ICD-10-CM

## 2022-05-12 DIAGNOSIS — R972 Elevated prostate specific antigen [PSA]: Secondary | ICD-10-CM

## 2022-05-12 DIAGNOSIS — E782 Mixed hyperlipidemia: Secondary | ICD-10-CM

## 2022-05-12 DIAGNOSIS — C20 Malignant neoplasm of rectum: Secondary | ICD-10-CM

## 2022-05-12 NOTE — Assessment & Plan Note (Signed)
Cholesterol is still high  Enc f/u with cardiology to disc tx options (poss pcyk9)  No symptoms or clinical changes F/u 1 wk for HTN

## 2022-05-12 NOTE — Assessment & Plan Note (Signed)
Declines audiology ref currently

## 2022-05-12 NOTE — Patient Instructions (Addendum)
Make sure you call and schedule your colonoscopy    For blood sugar Try to get most of your carbohydrates from produce (with the exception of white potatoes)  Eat less bread/pasta/rice/snack foods/cereals/sweets and other items from the middle of the grocery store (processed carbs)  Stay away from sugar beverages like juice/ sweet tea and soda  Alcohol is sugar also - use caution   For cholesterol Avoid red meat/ fried foods/ egg yolks/ fatty breakfast meats/ butter, cheese and high fat dairy/ and shellfish     Get BP cuff- OMRON for the arm size regular  Check only when relaxed  With arm supported at heart level   Follow up in approx a week   Also I will place a cardiology referral to discuss cholesterol  I put the referral in  Please let us know if you don't hear in 1-2 weeks

## 2022-05-12 NOTE — Assessment & Plan Note (Signed)
Reviewed health habits including diet and exercise and skin cancer prevention Reviewed appropriate screening tests for age  Also reviewed health mt list, fam hx and immunization status , as well as social and family history   See HPI Labs reviewed  Colonoscopy ordered-he will call to schedule  Urology care utd  New elevated aic and bp and lipids are high  Will start working on diet/exercise and f/u in 1 wk with a new bp cuff

## 2022-05-12 NOTE — Assessment & Plan Note (Signed)
BP: (!) 188/92  Pt has a large white coat issue in past  Enc to get omron cuff - start checking at home and f/u 1 wk with cuff Will likely start tx then   Handouts given Enc DASH eating [employer]innie [employer]and processed foods

## 2022-05-12 NOTE — Assessment & Plan Note (Signed)
Disc goals for lipids and reasons to control them Rev last labs with pt Rev low sat fat diet in detail Very high LDL of [address] 44.6%  In past statin raised lft  Declined zetia Urged f/u with cardiology- ? If candidate for pcyk9 Ref done Will also watch diet much more carefully

## 2022-05-12 NOTE — Assessment & Plan Note (Signed)
Need to work on cholesterol Will change diet Plan cardiology visit  No symptoms or angina

## 2022-05-12 NOTE — Assessment & Plan Note (Signed)
I do wonder if his elevated bp plays role Also tinnitus F/u in 1 wk

## 2022-05-12 NOTE — Assessment & Plan Note (Signed)
Last psa was high  Had prostate bx in fall-per pt nl  Continues urology care Has bph also

## 2022-05-12 NOTE — Assessment & Plan Note (Signed)
Due for 4 y colonoscopy  Ref is in He will call for appt

## 2022-05-12 NOTE — Assessment & Plan Note (Signed)
A1c is in dm range now   Lab Results  Component Value Date   HGBA1C 6.7 (H) 05/05/2022   Has several steroid shots for joints in recent past  Glucose over 200 Disc low glycemic diet  Disc further at his f/u in a wk Cholesterol and bp high also  Dm teaching may be a good idea

## 2022-05-12 NOTE — Progress Notes (Signed)
Subjective:    Patient ID: [name], male    DOB: January 13, 1951, 72 y.o.   MRN: [id number]  HPI Here for health maintenance exam and to review chronic medical problems    Wt Readings from Last 3 Encounters:  05/12/22 190 lb 2 oz (86.2 kg)  05/10/22 188 lb (85.3 kg)  11/23/20 190 lb 4 oz (86.3 kg)    26.15 kg/m  Vitals:   05/12/22 0936  BP: (!) 190/100  Pulse: 95  Temp: 98 F (36.7 C)  SpO2: 97%   Taking it easy   Fair self care -could be better Pretty sedentary recently  Had an accident -fall on attic stairs - hurt R arm and elbow and shoulder  Saw some specialists in [place] = no fx but did have rotator cuff tear  No surgery  Also some arthritis  Did some PT for 8 weeks  Could not play golf   Is much better with pain   Some episodes of vertigo when he lay down flat - on/off for a long time  Tinnitis is bad for years  Not ready for hearing aides yet  Problems hearing with background noise    Immunization History  Administered Date(s) Administered   Influenza, High Dose Seasonal PF 01/11/2018   Influenza,inj,Quad PF,6+ Mos 12/30/2016   Moderna Covid-19 Vaccine Bivalent Booster 23yr & up 12/21/2020   Moderna Sars-Covid-2 Vaccination 03/21/2019, 04/19/2019, 12/23/2019   Pneumococcal Conjugate-13 06/06/2016   Pneumococcal Polysaccharide-23 07/03/2017   Td 07/07/2003, 07/12/2016   Zoster Recombinat (Shingrix) 11/24/2020, 01/27/2021   Health Maintenance Due  Topic Date Due   COLONOSCOPY (Pts 45-466yrInsurance coverage will need to be confirmed)  04/12/2022    Colonoscopy 03/2017 with polyps with 5 y recall  Personal h/o rectal cancer  Just got the letter today   Colonoscopy order placed with recent [facility]  Father had colon cancer  Brother head colon cancer   Mother died of a very rare type of hepatitis/ ? Auto immune    Prostate health h/o elevated psa  Saw urology in nov Did bx for high psa of 5.9 (it was not malignant)  Noted BPH No nocturia   No urinary changes / takes a while to empty bladder but is patient    HTN- white coat BP Readings from Last 3 Encounters:  05/12/22 (!) 190/100  11/23/20 (!) 155/88  07/16/18 135/85   No medications  No aleve in a over a month   ? If HTN in family   Last night bp was 137/80s  As low as [id number]    Sees cardiology for CAD Aortic aherosclerosis  Hyperlipidemia Lab Results  Component Value Date   CHOL 315 (H) 05/05/2022   CHOL 311 (H) 11/23/2020   CHOL 320 (H) 07/03/2017   Lab Results  Component Value Date   HDL 46 05/05/2022   HDL 46.80 11/23/2020   HDL 49.90 07/03/2017   Lab Results  Component Value Date   LDLCALC 212 (H) 05/05/2022   Lab Results  Component Value Date   TRIG 283 (H) 05/05/2022   TRIG 322.0 (H) 11/23/2020   TRIG 309.0 (H) 07/03/2017   Lab Results  Component Value Date   CHOLHDL 6.8 (H) 05/05/2022   CHOLHDL 7 11/23/2020   CHOLHDL 6 07/03/2017   Lab Results  Component Value Date   LDLDIRECT 211.0 11/23/2020   LDLDIRECT 216.0 07/03/2017   LDLDIRECT 234.0 12/30/2016   The 10-year ASCVD risk score (Arnett DK, et al., 2019) is: 44.6%  Values used to calculate the score:     Age: 58 years     Sex: Male     Is Non-Hispanic African American: No     Diabetic: No     Tobacco smoker: No     Systolic Blood Pressure: 99991111 mmHg     Is BP treated: No     HDL Cholesterol: 46 mg/dL     Total Cholesterol: 315 mg/dL   Does eat a lot of fatty foods  Can change somewhat   Intol statin/ raised LFTs  Declined zetia  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     Prediabetes Lab Results  Component Value Date   HGBA1C 6.7 (H) 05/05/2022  Up from 6.3 Glucose 226  Diet is high in surgar  Started working on that -- (was eating a lot of sweets) Cut out sweet tea (had a lot of that)  Now water instead  A lot of snack foods and carbs     Patient Active Problem List   Diagnosis Date Noted   Tinnitus 05/12/2022    Episodic peripheral vertigo 05/12/2022   Medicare annual wellness visit, subsequent 11/23/2020   Coronary artery calcification seen on CT scan 03/19/2017   Aortic atherosclerosis ([place]) 03/19/2017   Essential hypertension 06/06/2016   Elevated PSA 06/16/2011   Routine general medical examination at a health care facility 06/16/2011   Prediabetes 06/14/2011   Prostate cancer screening 06/14/2011   Hyperlipidemia 09/08/2008   ADENOCARCINOMA, RECTUM 03/08/2007   NEPHROLITHIASIS, HX OF 03/08/2007   Past Medical History:  Diagnosis Date   Diverticulosis    Hyperlipidemia    Hypertension    borderline not on meds at this time   Nephrolithiasis    rectal ca dx'd 2005   rectal   Past Surgical History:  Procedure Laterality Date   COLONOSCOPY     enlarged prostate  08/2003   on CT scan   GANGLION CYST EXCISION Right 05/2016   GUM SURGERY  05/2018   cyst removal - benign   LOW ANTERIOR BOWEL RESECTION     Social History   Tobacco Use   Smoking status: Former    Types: Cigarettes   Smokeless tobacco: Never   Tobacco comments:    quit 23+years ago  Vaping Use   Vaping Use: Never used  Substance Use Topics   Alcohol use: Yes    Alcohol/week: 7.0 standard drinks of alcohol    Types: 3 Glasses of wine, 4 Standard drinks or equivalent per week    Comment: daily   Drug use: No   Family History  Problem Relation Age of Onset   Cancer Father 50       colon   Colon cancer Father    Colon polyps Father    Cancer Brother        colon   Colon cancer Brother    Colon polyps Brother    Esophageal cancer Neg Hx    Rectal cancer Neg Hx    Stomach cancer Neg Hx    Allergies  Allergen Reactions   Atorvastatin Other (See Comments)    REACTION: elevated transamines   Pravastatin Sodium Other (See Comments)    REACTION: mood problems/irritability/personality change   Current Outpatient Medications on File Prior to Visit  Medication Sig Dispense Refill   Loperamide HCl (IMODIUM  PO) Take by mouth daily as needed.     Multiple Vitamin (MULTIVITAMIN) tablet Take 1 tablet by mouth daily.     naproxen sodium (  ALEVE) 220 MG tablet Take 220 mg by mouth daily as needed.     No current facility-administered medications on file prior to visit.     Review of Systems  Constitutional:  Negative for activity change, appetite change, fatigue, fever and unexpected weight change.  HENT:  Positive for hearing loss and tinnitus. Negative for congestion, rhinorrhea, sore throat and trouble swallowing.   Eyes:  Negative for pain, redness, itching and visual disturbance.  Respiratory:  Negative for cough, chest tightness, shortness of breath and wheezing.   Cardiovascular:  Negative for chest pain and palpitations.  Gastrointestinal:  Negative for abdominal pain, blood in stool, constipation, diarrhea and nausea.  Endocrine: Negative for cold intolerance, heat intolerance, polydipsia, polyphagia and polyuria.  Genitourinary:  Negative for difficulty urinating, dysuria, frequency and urgency.  Musculoskeletal:  Negative for arthralgias, joint swelling and myalgias.  Skin:  Negative for pallor and rash.  Neurological:  Negative for dizziness, tremors, weakness, numbness and headaches.       Intermittent vertigo   Hematological:  Negative for adenopathy. Does not bruise/bleed easily.  Psychiatric/Behavioral:  Negative for decreased concentration and dysphoric mood. The patient is not nervous/anxious.        Objective:   Physical Exam Constitutional:      General: He is not in acute distress.    Appearance: Normal appearance. He is well-developed and normal weight. He is not ill-appearing or diaphoretic.  HENT:     Head: Normocephalic and atraumatic.     Right Ear: Tympanic membrane, ear canal and external ear normal.     Left Ear: Tympanic membrane, ear canal and external ear normal.     Nose: Nose normal. No congestion.     Mouth/Throat:     Mouth: Mucous membranes are moist.      Pharynx: Oropharynx is clear. No posterior oropharyngeal erythema.  Eyes:     General: No scleral icterus.       Right eye: No discharge.        Left eye: No discharge.     Conjunctiva/sclera: Conjunctivae normal.     Pupils: Pupils are equal, round, and reactive to light.  Neck:     Thyroid: No thyromegaly.     Vascular: No carotid bruit or JVD.  Cardiovascular:     Rate and Rhythm: Normal rate and regular rhythm.     Pulses: Normal pulses.     Heart sounds: Normal heart sounds.     No gallop.  Pulmonary:     Effort: Pulmonary effort is normal. No respiratory distress.     Breath sounds: Normal breath sounds. No wheezing or rales.     Comments: Good air exch Chest:     Chest wall: No tenderness.  Abdominal:     General: Bowel sounds are normal. There is no distension or abdominal bruit.     Palpations: Abdomen is soft. There is no mass.     Tenderness: There is no abdominal tenderness.     Hernia: No hernia is present.  Musculoskeletal:        General: No tenderness.     Cervical back: Normal range of motion and neck supple. No rigidity. No muscular tenderness.     Right lower leg: No edema.     Left lower leg: No edema.  Lymphadenopathy:     Cervical: No cervical adenopathy.  Skin:    General: Skin is warm and dry.     Coloration: Skin is not pale.     Findings: No  erythema or rash.  Neurological:     Mental Status: He is alert.     Cranial Nerves: No cranial nerve deficit.     Motor: No abnormal muscle tone.     Coordination: Coordination normal.     Gait: Gait normal.     Deep Tendon Reflexes: Reflexes are normal and symmetric. Reflexes normal.  Psychiatric:        Mood and Affect: Mood normal.        Cognition and Memory: Cognition normal.           Assessment & Plan:   Problem List Items Addressed This Visit       Cardiovascular and Mediastinum   Aortic atherosclerosis (HCC)    Cholesterol is still high  Enc f/u with cardiology to disc tx  options (poss pcyk9)  No symptoms or clinical changes F/u 1 wk for HTN      Coronary artery calcification seen on CT scan    Need to work on cholesterol Will change diet Plan cardiology visit  No symptoms or angina      Essential hypertension    BP: (!) 188/92  Pt has a large white coat issue in past  Enc to get omron cuff - start checking at home and f/u 1 wk with cuff Will likely start tx then   Handouts given Enc DASH eating /less processed foods         Digestive   ADENOCARCINOMA, RECTUM    Due for 5 y colonoscopy  Ref is in He will call for appt        Nervous and Auditory   Episodic peripheral vertigo    I do wonder if his elevated bp plays role Also tinnitus F/u in 1 wk        Other   Elevated PSA    Rev urology note from nov Per pt prostate bx was nl  BPH noted-not bothering him enough for medication       Encounter for general adult medical examination with abnormal findings - Primary    Reviewed health habits including diet and exercise and skin cancer prevention Reviewed appropriate screening tests for age  Also reviewed health mt list, fam hx and immunization status , as well as social and family history   See HPI Labs reviewed  Colonoscopy ordered-he will call to schedule  Urology care utd  New elevated aic and bp and lipids are high  Will start working on diet/exercise and f/u in 1 wk with a new bp cuff       Hyperlipidemia    Disc goals for lipids and reasons to control them Rev last labs with pt Rev low sat fat diet in detail Very high LDL of [address] 44.6%  In past statin raised lft  Declined zetia Urged f/u with cardiology- ? If candidate for pcyk9 Ref done Will also watch diet much more carefully      Prediabetes    A1c is in dm range now   Lab Results  Component Value Date   HGBA1C 6.7 (H) 05/05/2022  Has several steroid shots for joints in recent past  Glucose over 200 Disc low glycemic diet  Disc further at  his f/u in a wk Cholesterol and bp high also  Dm teaching may be a good idea       Prostate cancer screening    Last psa was high  Had prostate bx in fall-per pt nl  Continues urology care Has bph  also      Tinnitus    Declines audiology ref currently

## 2022-05-12 NOTE — Assessment & Plan Note (Signed)
Rev urology note from nov Per pt prostate bx was nl  BPH noted-not bothering him enough for medication

## 2022-05-18 ENCOUNTER — Encounter: Payer: Self-pay | Admitting: Family Medicine

## 2022-05-18 ENCOUNTER — Ambulatory Visit (INDEPENDENT_AMBULATORY_CARE_PROVIDER_SITE_OTHER): Payer: Medicare Other | Admitting: Family Medicine

## 2022-05-18 VITALS — BP 158/90 | HR 85 | Temp 97.8°F | Ht 71.5 in | Wt 190.0 lb

## 2022-05-18 DIAGNOSIS — R7303 Prediabetes: Secondary | ICD-10-CM

## 2022-05-18 DIAGNOSIS — I10 Essential (primary) hypertension: Secondary | ICD-10-CM

## 2022-05-18 MED ORDER — LOSARTAN POTASSIUM 50 MG PO TABS: 50.0000 mg | ORAL_TABLET | Freq: Every day | ORAL | 1 refills | Status: DC

## 2022-05-18 NOTE — Assessment & Plan Note (Signed)
Last A1c was up Lab Results  Component Value Date   HGBA1C 6.7 (H) 05/05/2022   Noted had steroid shots in the 3 mo prior   Is doing a good job so far with lifestyle change/lower glycemic diet  Disc opt for exercise  Expect improvement-will do A1c at 3 mo point

## 2022-05-18 NOTE — Progress Notes (Signed)
Subjective:    Patient ID: [name], male    DOB: [birth year], 72 y.o.   MRN: [id number]  HPI Pt presents for f/u of HTN  Wt Readings from Last 3 Encounters:  05/18/22 190 lb (86.2 kg)  05/12/22 190 lb 2 oz (86.2 kg)  05/10/22 188 lb (85.3 kg)   26.13 kg/m  Lost 3 lb   Vitals:   05/18/22 1028 05/18/22 1102  BP: (!) 178/96 (!) 158/90  Pulse: 85   Temp: 97.8 F (36.6 C)   SpO2: 97%     Has white coat hypertension   Checking at home  Pt's cuff looks fairly accurate  Bp at home  [id number] avg systolic  Diastolic [id number]   BP Readings from Last 3 Encounters:  05/18/22 (!) 158/90  05/12/22 (!) 188/92  11/23/20 (!) 155/88   Pulse Readings from Last 3 Encounters:  05/18/22 85  05/12/22 95  11/23/20 (!) 57    No headaches No edema   Elevated A1c Lab Results  Component Value Date   HGBA1C 6.7 (H) 05/05/2022   Had several steroid shots   Since then  Quit eating junk for the most part  Quit eating cookies  He does crave sugar   Less biscuits   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- now just eating a small amt   Patient Active Problem List   Diagnosis Date Noted   Tinnitus 05/12/2022   Episodic peripheral vertigo 05/12/2022   Medicare annual wellness visit, subsequent 11/23/2020   Coronary artery calcification seen on CT scan 03/19/2017   Aortic atherosclerosis ([place]) 03/19/2017   Essential hypertension 06/06/2016   Elevated PSA 06/16/2011   Encounter for general adult medical examination with abnormal findings 06/16/2011   Prediabetes 06/14/2011   Prostate cancer screening 06/14/2011   Hyperlipidemia 09/08/2008   ADENOCARCINOMA, RECTUM 03/08/2007   NEPHROLITHIASIS, HX OF 03/08/2007   Past Medical History:  Diagnosis Date   Diverticulosis    Hyperlipidemia    Hypertension    borderline not on meds at this time   Nephrolithiasis    rectal ca dx'd 2005   rectal   Past Surgical History:  Procedure Laterality Date   COLONOSCOPY     enlarged prostate   08/2003   on CT scan   GANGLION CYST EXCISION Right 05/2016   GUM SURGERY  05/2018   cyst removal - benign   LOW ANTERIOR BOWEL RESECTION     Social History   Tobacco Use   Smoking status: Former    Types: Cigarettes   Smokeless tobacco: Never   Tobacco comments:    quit 23+years ago  Vaping Use   Vaping Use: Never used  Substance Use Topics   Alcohol use: Yes    Alcohol/week: 7.0 standard drinks of alcohol    Types: 3 Glasses of wine, 4 Standard drinks or equivalent per week    Comment: daily   Drug use: No   Family History  Problem Relation Age of Onset   Cancer Father 40       colon   Colon cancer Father    Colon polyps Father    Cancer Brother        colon   Colon cancer Brother    Colon polyps Brother    Esophageal cancer Neg Hx    Rectal cancer Neg Hx    Stomach cancer Neg Hx    Allergies  Allergen Reactions   Atorvastatin Other (See Comments)    REACTION: elevated transamines   Pravastatin Sodium Other (  See Comments)    REACTION: mood problems/irritability/personality change   Current Outpatient Medications on File Prior to Visit  Medication Sig Dispense Refill   Loperamide HCl (IMODIUM PO) Take by mouth daily as needed.     Multiple Vitamin (MULTIVITAMIN) tablet Take 1 tablet by mouth daily.     naproxen sodium (ALEVE) 220 MG tablet Take 220 mg by mouth daily as needed.     No current facility-administered medications on file prior to visit.     Review of Systems  Constitutional:  Negative for activity change, appetite change, fatigue, fever and unexpected weight change.  HENT:  Negative for congestion, rhinorrhea, sore throat and trouble swallowing.   Eyes:  Negative for pain, redness, itching and visual disturbance.  Respiratory:  Negative for cough, chest tightness, shortness of breath and wheezing.   Cardiovascular:  Negative for chest pain and palpitations.  Gastrointestinal:  Negative for abdominal pain, blood in stool, constipation, diarrhea  and nausea.  Endocrine: Negative for cold intolerance, heat intolerance, polydipsia and polyuria.  Genitourinary:  Negative for difficulty urinating, dysuria, frequency and urgency.  Musculoskeletal:  Negative for arthralgias, joint swelling and myalgias.  Skin:  Negative for pallor and rash.  Neurological:  Negative for dizziness, tremors, weakness, numbness and headaches.       Not c/o dizziness  Hematological:  Negative for adenopathy. Does not bruise/bleed easily.  Psychiatric/Behavioral:  Negative for decreased concentration and dysphoric mood. The patient is not nervous/anxious.        Objective:   Physical Exam Constitutional:      General: He is not in acute distress.    Appearance: Normal appearance. He is well-developed and normal weight. He is not ill-appearing or diaphoretic.  HENT:     Head: Normocephalic and atraumatic.     Mouth/Throat:     Mouth: Mucous membranes are moist.  Eyes:     General: No scleral icterus.    Conjunctiva/sclera: Conjunctivae normal.     Pupils: Pupils are equal, round, and reactive to light.  Neck:     Thyroid: No thyromegaly.     Vascular: No carotid bruit or JVD.  Cardiovascular:     Rate and Rhythm: Normal rate and regular rhythm.     Heart sounds: Normal heart sounds.     No gallop.  Pulmonary:     Effort: Pulmonary effort is normal. No respiratory distress.     Breath sounds: Normal breath sounds. No wheezing or rales.  Abdominal:     General: There is no distension or abdominal bruit.     Palpations: Abdomen is soft.  Musculoskeletal:     Cervical back: Normal range of motion and neck supple.     Right lower leg: No edema.     Left lower leg: No edema.  Lymphadenopathy:     Cervical: No cervical adenopathy.  Skin:    General: Skin is warm and dry.     Coloration: Skin is not jaundiced or pale.     Findings: No bruising or rash.  Neurological:     Mental Status: He is alert.     Cranial Nerves: No cranial nerve deficit.      Coordination: Coordination normal.     Deep Tendon Reflexes: Reflexes are normal and symmetric. Reflexes normal.  Psychiatric:        Mood and Affect: Mood normal.           Assessment & Plan:   Problem List Items Addressed This Visit  Cardiovascular and Mediastinum   Essential hypertension - Primary    Some white coat component but still elevated at home (just less than here)  His cuff seems fairly accurate and is new  Commended lifestyle change so far  BP: (!) 158/90   Disc opt for treatment  Will try losartan 50 mg daily -handout given  Inst to call if side eff or problems  Enc to continue lower glycemic and lower processed diet  F/u in 2-3 wk  Disc exercise options also       Relevant Medications   losartan (COZAAR) 50 MG tablet     Other   Prediabetes    Last A1c was up Lab Results  Component Value Date   HGBA1C 6.7 (H) 05/05/2022  Noted had steroid shots in the 3 mo prior   Is doing a good job so far with lifestyle change/lower glycemic diet  Disc opt for exercise  Expect improvement-will do A1c at 3 mo point

## 2022-05-18 NOTE — Patient Instructions (Addendum)
Start losartan 50 mg daily for blood pressure  If any side effects or problems let me know   Keep working on a low glycemic diet  Try to get most of your carbohydrates from produce (with the exception of white potatoes)  Eat less bread/pasta/rice/snack foods/cereals/sweets and other items from the middle of the grocery store (processed carbs)  Keep walking  Add some strength training to your routine, this is important for bone and brain health and can reduce your risk of falls and help your body use insulin properly and regulate weight  Light weights, exercise bands , and internet videos are a good way to start  Yoga (chair or regular), machines , floor exercises or a gym with machines are also good options

## 2022-05-18 NOTE — Assessment & Plan Note (Signed)
Some white coat component but still elevated at home (just less than here)  His cuff seems fairly accurate and is new  Commended lifestyle change so far  BP: (!) 158/90   Disc opt for treatment  Will try losartan 50 mg daily -handout given  Inst to call if side eff or problems  Enc to continue lower glycemic and lower processed diet  F/u in 2-3 wk  Disc exercise options also

## 2022-05-25 ENCOUNTER — Ambulatory Visit (AMBULATORY_SURGERY_CENTER): Payer: Medicare Other | Admitting: *Deleted

## 2022-05-25 ENCOUNTER — Encounter: Payer: Self-pay | Admitting: Internal Medicine

## 2022-05-25 VITALS — Ht 71.5 in | Wt 188.0 lb

## 2022-05-25 DIAGNOSIS — Z85048 Personal history of other malignant neoplasm of rectum, rectosigmoid junction, and anus: Secondary | ICD-10-CM

## 2022-05-25 DIAGNOSIS — D490 Neoplasm of unspecified behavior of digestive system: Secondary | ICD-10-CM

## 2022-05-25 MED ORDER — NA SULFATE-K SULFATE-MG SULF 17.5-3.13-1.6 GM/177ML PO SOLN: 1.0000 | Freq: Once | ORAL | 0 refills | Status: AC

## 2022-05-25 NOTE — Progress Notes (Signed)
Pt's previsit is done over the phone and all paperwork (prep instructions) sent to patient. Pt's name and DOB verified at the beginning of the previsit. Pt denies any difficulty with ambulating.  No egg or soy allergy known to patient  No issues known to pt with past sedation with any surgeries or procedures Patient denies ever being intubated No FH of Malignant Hyperthermia Pt is not on diet pills Pt is not on  home 02  Pt is not on blood thinners  Pt denies issues with constipation  Pt is not on dialysis Pt denies any upcoming cardiac testing Pt encouraged to use to use Singlecare or Goodrx to reduce cost  Patient's chart reviewed by Osvaldo Angst CNRA prior to previsit and patient appropriate for the [place].  Previsit completed and red dot placed by patient's name on their procedure day (on provider's schedule).  . Visit by phone Pt states weight is 188 lb Instructions reviewed with pt and pt states understanding. Instructed to review again prior to procedure. Pt states they will.  Instructions sent by mail with coupon and by my chart

## 2022-06-01 ENCOUNTER — Encounter: Payer: Self-pay | Admitting: Family Medicine

## 2022-06-01 ENCOUNTER — Ambulatory Visit (INDEPENDENT_AMBULATORY_CARE_PROVIDER_SITE_OTHER): Payer: Medicare Other | Admitting: Family Medicine

## 2022-06-01 VITALS — BP 140/80 | HR 85 | Temp 97.6°F | Ht 71.5 in | Wt 189.1 lb

## 2022-06-01 DIAGNOSIS — I10 Essential (primary) hypertension: Secondary | ICD-10-CM | POA: Diagnosis not present

## 2022-06-01 MED ORDER — LOSARTAN POTASSIUM 100 MG PO TABS: 100.0000 mg | ORAL_TABLET | Freq: Every day | ORAL | 1 refills | Status: DC

## 2022-06-01 NOTE — Assessment & Plan Note (Signed)
Some progress so far here and at home with losartan 50 mg daily   BP: (!) 140/80  Working on healthy diet and exercise also   Plan to inc to 100 mg  Inst to call if problems/side eff or low bp  Monitor at home Continue lifestyle change F/u in 2-3 wk for visit and labs

## 2022-06-01 NOTE — Progress Notes (Signed)
Subjective:    Patient ID: [name], male    DOB: [birth year], 72 y.o.   MRN: [id number]  HPI Pt presents for f/u of HTN  Wt Readings from Last 3 Encounters:  06/01/22 189 lb 2 oz (85.8 kg)  05/25/22 188 lb (85.3 kg)  05/18/22 190 lb (86.2 kg)   26.01 kg/m  Vitals:   06/01/22 0954 06/01/22 1013  BP: (!) 148/92 (!) 140/80  Pulse: 85   Temp: 97.6 F (36.4 C)   SpO2: 97%        Last visit started tx with losartan 50 mg daily  Discussed lifestyle change   BP Readings from Last 3 Encounters:  06/01/22 (!) 140/80  05/18/22 (!) 158/90  05/12/22 (!) 188/92    Pulse Readings from Last 3 Encounters:  06/01/22 85  05/18/22 85  05/12/22 95    At home his bp range usually 130s to 140s  70s-80s  Is higher first thing in am  Lower in the evening   Seems to have more energy  Feels a bit better   Eating is better  Has not had a fritter in 2 weeks  No sweet tea in 3 weeks  Drinks more water now   Exercise- is doing crunches/ ab work Some modified push ups  Walking   Last metabolic panel Lab Results  Component Value Date   GLUCOSE 226 (H) 05/05/2022   NA 135 05/05/2022   K 4.5 05/05/2022   CL 99 05/05/2022   CO2 24 05/05/2022   BUN 9 05/05/2022   CREATININE 1.08 05/05/2022   EGFR 73 05/05/2022   CALCIUM 10.0 05/05/2022   PROT 7.4 05/05/2022   ALBUMIN 4.4 05/05/2022   LABGLOB 3.0 05/05/2022   AGRATIO 1.5 05/05/2022   BILITOT 0.3 05/05/2022   ALKPHOS 123 (H) 05/05/2022   AST 20 05/05/2022   ALT 25 05/05/2022   ANIONGAP 14 11/15/2016  GFR 73  Patient Active Problem List   Diagnosis Date Noted   Tinnitus 05/12/2022   Episodic peripheral vertigo 05/12/2022   Medicare annual wellness visit, subsequent 11/23/2020   Coronary artery calcification seen on CT scan 03/19/2017   Aortic atherosclerosis 03/19/2017   Essential hypertension 06/06/2016   Elevated PSA 06/16/2011   Encounter for general adult medical examination with abnormal findings  06/16/2011   Prediabetes 06/14/2011   Prostate cancer screening 06/14/2011   Hyperlipidemia 09/08/2008   ADENOCARCINOMA, RECTUM 03/08/2007   NEPHROLITHIASIS, HX OF 03/08/2007   Past Medical History:  Diagnosis Date   Diverticulosis    Hyperlipidemia    Hypertension    borderline not on meds at this time   Nephrolithiasis    rectal ca dx'd 2005   rectal   Past Surgical History:  Procedure Laterality Date   COLONOSCOPY     enlarged prostate  08/2003   on CT scan   GANGLION CYST EXCISION Right 05/2016   GUM SURGERY  05/2018   cyst removal - benign   LOW ANTERIOR BOWEL RESECTION     Social History   Tobacco Use   Smoking status: Former    Types: Cigarettes   Smokeless tobacco: Never   Tobacco comments:    quit 23+years ago  Vaping Use   Vaping Use: Never used  Substance Use Topics   Alcohol use: Yes    Alcohol/week: 7.0 standard drinks of alcohol    Types: 3 Glasses of wine, 4 Standard drinks or equivalent per week    Comment: daily   Drug use: No  Family History  Problem Relation Age of Onset   Cancer Father 17       colon   Colon cancer Father    Colon polyps Father    Cancer Brother        colon   Colon cancer Brother    Colon polyps Brother    Esophageal cancer Neg Hx    Rectal cancer Neg Hx    Stomach cancer Neg Hx    Allergies  Allergen Reactions   Atorvastatin Other (See Comments)    REACTION: elevated transamines   Pravastatin Sodium Other (See Comments)    REACTION: mood problems/irritability/personality change   Current Outpatient Medications on File Prior to Visit  Medication Sig Dispense Refill   Loperamide HCl (IMODIUM PO) Take by mouth daily as needed.     Multiple Vitamin (MULTIVITAMIN) tablet Take 1 tablet by mouth daily.     naproxen sodium (ALEVE) 220 MG tablet Take 220 mg by mouth daily as needed.     No current facility-administered medications on file prior to visit.      Review of Systems  Constitutional:  Negative for  activity change, appetite change, fatigue, fever and unexpected weight change.  HENT:  Negative for congestion, rhinorrhea, sore throat and trouble swallowing.   Eyes:  Negative for pain, redness, itching and visual disturbance.  Respiratory:  Negative for cough, chest tightness, shortness of breath and wheezing.   Cardiovascular:  Negative for chest pain and palpitations.  Gastrointestinal:  Negative for abdominal pain, blood in stool, constipation, diarrhea and nausea.  Endocrine: Negative for cold intolerance, heat intolerance, polydipsia and polyuria.  Genitourinary:  Negative for difficulty urinating, dysuria, frequency and urgency.  Musculoskeletal:  Negative for arthralgias, joint swelling and myalgias.  Skin:  Negative for pallor and rash.  Neurological:  Negative for dizziness, tremors, weakness, numbness and headaches.  Hematological:  Negative for adenopathy. Does not bruise/bleed easily.  Psychiatric/Behavioral:  Negative for decreased concentration and dysphoric mood. The patient is not nervous/anxious.        Objective:   Physical Exam Constitutional:      General: He is not in acute distress.    Appearance: Normal appearance. He is well-developed and normal weight. He is not ill-appearing or diaphoretic.  HENT:     Head: Normocephalic and atraumatic.     Mouth/Throat:     Mouth: Mucous membranes are moist.  Eyes:     General: No scleral icterus.    Conjunctiva/sclera: Conjunctivae normal.     Pupils: Pupils are equal, round, and reactive to light.  Neck:     Thyroid: No thyromegaly.     Vascular: No carotid bruit or JVD.  Cardiovascular:     Rate and Rhythm: Normal rate and regular rhythm.     Heart sounds: Normal heart sounds.     No gallop.  Pulmonary:     Effort: Pulmonary effort is normal. No respiratory distress.     Breath sounds: Normal breath sounds. No wheezing or rales.  Abdominal:     General: There is no distension or abdominal bruit.      Palpations: Abdomen is soft.  Musculoskeletal:     Cervical back: Normal range of motion and neck supple.     Right lower leg: No edema.     Left lower leg: No edema.  Lymphadenopathy:     Cervical: No cervical adenopathy.  Skin:    General: Skin is warm and dry.     Coloration: Skin is not pale.  Findings: No rash.  Neurological:     Mental Status: He is alert.     Coordination: Coordination normal.     Deep Tendon Reflexes: Reflexes are normal and symmetric. Reflexes normal.  Psychiatric:        Mood and Affect: Mood normal.           Assessment & Plan:   Problem List Items Addressed This Visit       Cardiovascular and Mediastinum   Essential hypertension - Primary    Some progress so far here and at home with losartan 50 mg daily   BP: (!) 140/80  Working on healthy diet and exercise also   Plan to inc to 100 mg  Inst to call if problems/side eff or low bp  Monitor at home Continue lifestyle change F/u in 2-3 wk for visit and labs         Relevant Medications   losartan (COZAAR) 100 MG tablet

## 2022-06-01 NOTE — Patient Instructions (Addendum)
Go up on losartan to 100 mg daily  (2 of the 50s that you have left over) Then new px will be for 100 mg   If any side effects or problems let us knwo   Keep working on diet and exercise   Follow up in 2-3 weeks   Keep watching your blood pressure

## 2022-06-03 ENCOUNTER — Telehealth: Payer: Self-pay | Admitting: Family Medicine

## 2022-06-03 NOTE — Telephone Encounter (Signed)
Thanks for the heads up  It may take a little more time on that dose to see improvement  Please continue to monitor and update me Monday   If any new symptoms or side effects please call earlier

## 2022-06-03 NOTE — Telephone Encounter (Signed)
Pt notified of Dr. Tower's comments and verbalized understanding  

## 2022-06-03 NOTE — Telephone Encounter (Signed)
Pt notified of Dr. Royden Purl comments.  **pt said he is having problems on med**   Pt said he started with increased dose yesterday and took double dose today also. Pt said all day he has been dealing with severe lightheadedness that is concerning to him. Pt said it was constant and stopped around 2pm.   Pt said his main point he wants to relay to PCP is when he was on lower dose of med his BP was "coming down" and he felt fine and now he is on the higher dose his BP is even higher and he is dealing with lightheadedness. Pt said he wouldn't call it dizziness just lightheaded

## 2022-06-03 NOTE — Telephone Encounter (Signed)
So sorry= I had NO idea that he had symptoms on the higher dose Thanks for letting me know  Go back to the 50 mg over the weekend  Update Monday   I want to make sure the dizziness is from bp and not something else  Once he updates [place] on Monday will make a plan for the bp  Thanks

## 2022-06-03 NOTE — Telephone Encounter (Signed)
Patient called in stating that the 50 mg of losartan (COZAAR) 100 MG tablet had his b/p lowered.He was told to start doubling it to 100 mg,and today he called in stating that his b/p has elevated with the double dose.Today it was(MORNING)158/86 and 167/88)NOW AT NOON). He would like to know what should he do?Please advise.

## 2022-06-06 NOTE — Telephone Encounter (Signed)
Pt called back with an update. His light headedness has resolved since being on the 50 mg. This morning BP 144/80, yesterday 128/74, and he said the other times he checked it this week it has been running mostly in normal range

## 2022-06-06 NOTE — Telephone Encounter (Signed)
Spoke to patient gave instructions and repeated back to me. Will call if any questions or has readings consistently over 140/90. Have updated his med list

## 2022-06-06 NOTE — Telephone Encounter (Signed)
Thanks for the heads up Stay on 50 and please change on med list   Give it some more time Check bp at random times  Get [place] some readings in the next month or so  Goal bp under 140/90

## 2022-06-16 ENCOUNTER — Ambulatory Visit (AMBULATORY_SURGERY_CENTER): Payer: Medicare Other | Admitting: Internal Medicine

## 2022-06-16 ENCOUNTER — Encounter: Payer: Self-pay | Admitting: Internal Medicine

## 2022-06-16 VITALS — BP 111/63 | HR 58 | Temp 97.8°F | Resp 12 | Ht 71.0 in | Wt 188.0 lb

## 2022-06-16 DIAGNOSIS — Z85048 Personal history of other malignant neoplasm of rectum, rectosigmoid junction, and anus: Secondary | ICD-10-CM

## 2022-06-16 DIAGNOSIS — D123 Benign neoplasm of transverse colon: Secondary | ICD-10-CM

## 2022-06-16 DIAGNOSIS — Z08 Encounter for follow-up examination after completed treatment for malignant neoplasm: Secondary | ICD-10-CM | POA: Diagnosis not present

## 2022-06-16 MED ORDER — SODIUM CHLORIDE 0.9 % IV SOLN: 500.0000 mL | Freq: Once | INTRAVENOUS | Status: DC

## 2022-06-16 NOTE — Progress Notes (Signed)
Called to room to assist during endoscopic procedure.  Patient ID and intended procedure confirmed with present staff. Received instructions for my participation in the procedure from the performing physician.  

## 2022-06-16 NOTE — Progress Notes (Signed)
HISTORY OF PRESENT ILLNESS:  [name] is a 72 y.o. male with a history of colorectal cancer.  Presents today for surveillance colonoscopy.  No complaints  REVIEW OF SYSTEMS:  All non-GI ROS negative except for  Past Medical History:  Diagnosis Date   Diverticulosis    Hyperlipidemia    Hypertension    borderline not on meds at this time   Nephrolithiasis    rectal ca dx'd 2005   rectal    Past Surgical History:  Procedure Laterality Date   COLONOSCOPY     enlarged prostate  08/2003   on CT scan   GANGLION CYST EXCISION Right 05/2016   GUM SURGERY  05/2018   cyst removal - benign   LOW ANTERIOR BOWEL RESECTION      Social History [name]  reports that he has quit smoking. His smoking use included cigarettes. He has never used smokeless tobacco. He reports current alcohol use of about 7.0 standard drinks of alcohol per week. He reports that he does not use drugs.  family history includes Cancer in his brother; Cancer (age of onset: 31) in his father; Colon cancer in his brother and father; Colon polyps in his brother and father.  Allergies  Allergen Reactions   Atorvastatin Other (See Comments)    REACTION: elevated transamines   Pravastatin Sodium Other (See Comments)    REACTION: mood problems/irritability/personality change       PHYSICAL EXAMINATION: Vital signs: BP (!) 159/86   Pulse 97   Temp 97.8 F (36.6 C)   Resp (!) 9   Ht  (1.803 m)   Wt 188 lb (85.3 kg)   SpO2 99%   BMI 26.22 kg/m  General: Well-developed, well-nourished, no acute distress HEENT: Sclerae are anicteric, conjunctiva pink. Oral mucosa intact Lungs: Clear Heart: Regular Abdomen: soft, nontender, nondistended, no obvious ascites, no peritoneal signs, normal bowel sounds. No organomegaly. Extremities: No edema Psychiatric: alert and oriented x3. Cooperative     ASSESSMENT:  History of colorectal cancer status post LAR 2005   PLAN:  Surveillance  colonoscopy

## 2022-06-16 NOTE — Op Note (Addendum)
[facility] Patient Name: [name] Procedure Date: 06/16/2022 12:07 PM MRN: [id number] Endoscopist: Wilhemina Bonito. Marina Goodell , MD, [id number] Age: 72 Referring MD:  Date of Birth: 10-Mar-1950 Gender: Male Account #: [id number] Procedure:                Colonoscopy with cold snare polypectomy x 2 Indications:              High risk colon cancer surveillance: Personal                            history of colon cancer; personal history of                            multiple adenomatous polyps; status post LAR 2005.                            Surveillance examinations 2006, 2007, 2010, 2013,                            2019 Medicines:                Monitored Anesthesia Care Procedure:                Pre-Anesthesia Assessment:                           - Prior to the procedure, a History and Physical                            was performed, and patient medications and                            allergies were reviewed. The patient's tolerance of                            previous anesthesia was also reviewed. The risks                            and benefits of the procedure and the sedation                            options and risks were discussed with the patient.                            All questions were answered, and informed consent                            was obtained. Prior Anticoagulants: The patient has                            taken no anticoagulant or antiplatelet agents. ASA                            Grade Assessment: II - A patient with mild systemic  disease. After reviewing the risks and benefits,                            the patient was deemed in satisfactory condition to                            undergo the procedure.                           After obtaining informed consent, the colonoscope                            was passed under direct vision. Throughout the                            procedure, the patient's blood  pressure, pulse, and                            oxygen saturations were monitored continuously. The                            Olympus CF-[id number]) Colonoscope was                            introduced through the anus and advanced to the the                            cecum, identified by appendiceal orifice and                            ileocecal valve. The ileocecal valve, appendiceal                            orifice, and rectum were photographed. The quality                            of the bowel preparation was excellent. The                            colonoscopy was performed without difficulty. The                            patient tolerated the procedure well. The bowel                            preparation used was SUPREP via split dose                            instruction. Scope In: 12:23:13 PM Scope Out: 12:34:48 PM Scope Withdrawal Time: 0 hours 10 minutes 3 seconds  Total Procedure Duration: 0 hours 11 minutes 35 seconds  Findings:                 Two polyps were found in the transverse colon. The  polyps were 2 to 3 mm in size. These polyps were                            removed with a cold snare. Resection and retrieval                            were complete.                           Diverticula were found in the entire colon.                            Unremarkable surgical anastomosis at approximately                            13 cm from the anal verge.                           The exam was otherwise without abnormality on                            direct and retroflexion views. Complications:            No immediate complications. Estimated blood loss:                            None. Estimated Blood Loss:     Estimated blood loss: none. Impression:               - Two 2 to 3 mm polyps in the transverse colon,                            removed with a cold snare. Resected and retrieved.                           -  Diverticulosis in the entire examined colon.                            Status post LAR.                           - The examination was otherwise normal on direct                            and retroflexion views. Recommendation:           - Repeat colonoscopy in 5 years for surveillance.                           - Patient has a contact number available for                            emergencies. The signs and symptoms of potential                            delayed complications were  discussed with the                            patient. Return to normal activities tomorrow.                            Written discharge instructions were provided to the                            patient.                           - Resume previous diet.                           - Continue present medications.                           - Await pathology results. Wilhemina Bonito. Marina Goodell, MD 06/16/2022 12:47:46 PM This report has been signed electronically.

## 2022-06-16 NOTE — Progress Notes (Signed)
Pt resting comfortably. VSS. Airway intact. SBAR complete to RN. All questions answered.   

## 2022-06-16 NOTE — Patient Instructions (Signed)
Thank you for coming in to see us today! Resume your diet and medications today. Return to regular daily activities tomorrow. Recommend next surveillance colonoscopy in 5 years.   YOU HAD AN ENDOSCOPIC PROCEDURE TODAY AT THE [facility]:   Refer to the procedure report that was given to you for any specific questions about what was found during the examination.  If the procedure report does not answer your questions, please call your gastroenterologist to clarify.  If you requested that your care partner not be given the details of your procedure findings, then the procedure report has been included in a sealed envelope for you to review at your convenience later.  YOU SHOULD EXPECT: Some feelings of bloating in the abdomen. Passage of more gas than usual.  Walking can help get rid of the air that was put into your GI tract during the procedure and reduce the bloating. If you had a lower endoscopy (such as a colonoscopy or flexible sigmoidoscopy) you may notice spotting of blood in your stool or on the toilet paper. If you underwent a bowel prep for your procedure, you may not have a normal bowel movement for a few days.  Please Note:  You might notice some irritation and congestion in your nose or some drainage.  This is from the oxygen used during your procedure.  There is no need for concern and it should clear up in a day or so.  SYMPTOMS TO REPORT IMMEDIATELY:  Following lower endoscopy (colonoscopy or flexible sigmoidoscopy):  Excessive amounts of blood in the stool  Significant tenderness or worsening of abdominal pains  Swelling of the abdomen that is new, acute  Fever of 100F or higher    For urgent or emergent issues, a gastroenterologist can be reached at any hour by calling [phone]. Do not use MyChart messaging for urgent concerns.    DIET:  We do recommend a small meal at first, but then you may proceed to your regular diet.  Drink plenty of fluids but you  should avoid alcoholic beverages for 24 hours.  ACTIVITY:  You should plan to take it easy for the rest of today and you should NOT DRIVE or use heavy machinery until tomorrow (because of the sedation medicines used during the test).    FOLLOW UP: Our staff will call the number listed on your records the next business day following your procedure.  We will call around 7:15- 8:00 am to check on you and address any questions or concerns that you may have regarding the information given to you following your procedure. If we do not reach you, we will leave a message.     If any biopsies were taken you will be contacted by phone or by letter within the next 1-3 weeks.  Please call us at [phone] if you have not heard about the biopsies in 3 weeks.    SIGNATURES/CONFIDENTIALITY: You and/or your care partner have signed paperwork which will be entered into your electronic medical record.  These signatures attest to the fact that that the information above on your After Visit Summary has been reviewed and is understood.  Full responsibility of the confidentiality of this discharge information lies with you and/or your care-partner. 

## 2022-06-16 NOTE — Progress Notes (Signed)
Pt's states no medical or surgical changes since previsit or office visit. 

## 2022-06-17 ENCOUNTER — Telehealth: Payer: Self-pay | Admitting: *Deleted

## 2022-06-17 NOTE — Telephone Encounter (Signed)
  Follow up Call-     06/16/2022   11:12 AM  Call back number  Post procedure Call Back phone  # [phone]  Permission to leave phone message Yes     Patient questions:  Do you have a fever, pain , or abdominal swelling? No. Pain Score  0 *  Have you tolerated food without any problems? Yes.    Have you been able to return to your normal activities? Yes.    Do you have any questions about your discharge instructions: Diet   No. Medications  No. Follow up visit  No.  Do you have questions or concerns about your Care? No.  Actions: * If pain score is 4 or above: No action needed, pain <4.

## 2022-06-20 ENCOUNTER — Encounter: Payer: Self-pay | Admitting: Internal Medicine

## 2022-06-22 ENCOUNTER — Encounter: Payer: Self-pay | Admitting: Family Medicine

## 2022-06-22 ENCOUNTER — Ambulatory Visit (INDEPENDENT_AMBULATORY_CARE_PROVIDER_SITE_OTHER): Payer: Medicare Other | Admitting: Family Medicine

## 2022-06-22 VITALS — BP 141/70 | HR 68 | Temp 97.6°F | Ht 71.0 in | Wt 190.1 lb

## 2022-06-22 DIAGNOSIS — R7303 Prediabetes: Secondary | ICD-10-CM

## 2022-06-22 DIAGNOSIS — I10 Essential (primary) hypertension: Secondary | ICD-10-CM | POA: Diagnosis not present

## 2022-06-22 NOTE — Patient Instructions (Addendum)
Goal for bp is   Below 140 top Below 90 bottom   Continue the losartan at 50 mg daily for now  If light headedness worsens or does not improve let us know   Drink water Watch your diet for processed foods  Keep exercising     Add some strength training to your routine, this is important for bone and brain health and can reduce your risk of falls and help your body use insulin properly and regulate weight  Light weights, exercise bands , and internet videos are a good way to start  Yoga (chair or regular), machines , floor exercises or a gym with machines are also good options    Follow up after June 7th for visit HTN and blood sugar

## 2022-06-22 NOTE — Assessment & Plan Note (Signed)
Lab Results  Component Value Date   HGBA1C 6.7 (H) 05/05/2022    This was after steroid shots in month prior  Doing much better now with low glycemic eating  Will f/u in June Expect improvement Enc exercise as well

## 2022-06-22 NOTE — Assessment & Plan Note (Signed)
Bp is borderline BP: (!) 141/70  He tried going up on losartan to 100 mg but developed side eff of light headedness (though bp was not low)  Wants to stay on current dose of 50 and not add anything for now  Will continue to work on diet and exercise (low processed food/sodium, consider adding strength training) Then f/u in June for re check  Last labs reviewed

## 2022-06-22 NOTE — Progress Notes (Signed)
Subjective:    Patient ID: [name], male    DOB: Jul 10, 1950, 72 y.o.   MRN: [id number]  HPI Pt presents for f/u of HTN   Wt Readings from Last 3 Encounters:  06/22/22 190 lb 2 oz (86.2 kg)  06/16/22 188 lb (85.3 kg)  06/01/22 189 lb 2 oz (85.8 kg)   26.52 kg/m  Vitals:   06/22/22 1050 06/22/22 1120  BP: (!) 158/68 (!) 141/70  Pulse: 68   Temp: 97.6 F (36.4 C)   SpO2: 98%    Bp is better on 2nd check today and correlate with his readings at home    HTN bp is stable today  No cp or palpitations or headaches or edema  No side effects to medicines  BP Readings from Last 3 Encounters:  06/22/22 (!) 141/70  06/16/22 111/63  06/01/22 (!) 140/80      Increased his losartan from 50 to 100  He tried this and became severely light headed  He cut back to 50 and felt a lot better   On 4/7 bp was 128/74 on the 50 mg   Still feels a little light headed  Esp early in the am   First thing in am usually 130s to 140s  Pm are are lower (avg 130s- does get into 120s or one teens )  Wife has breast cancer - very stressful    Lifestyle change  No sugar drinks!  (Much better)  Drinks water   Fruit and sugar free things for his sweet teeth    Exercise- push ups/sit ups and walking     Did colonoscopy last wk-normal    Has known mild 3 vessel CAD Seen cardiology in past   Lab Results  Component Value Date   CREATININE 1.08 05/05/2022   BUN 9 05/05/2022   NA 135 05/05/2022   K 4.5 05/05/2022   CL 99 05/05/2022   CO2 24 05/05/2022   GFR of 73  Prediabetes Lab Results  Component Value Date   HGBA1C 6.7 (H) 05/05/2022   This was after several steroid shots   Patient Active Problem List   Diagnosis Date Noted   Tinnitus 05/12/2022   Episodic peripheral vertigo 05/12/2022   Medicare annual wellness visit, subsequent 11/23/2020   Coronary artery calcification seen on CT scan 03/19/2017   Aortic atherosclerosis 03/19/2017   Essential hypertension  06/06/2016   Elevated PSA 06/16/2011   Encounter for general adult medical examination with abnormal findings 06/16/2011   Prediabetes 06/14/2011   Prostate cancer screening 06/14/2011   Hyperlipidemia 09/08/2008   ADENOCARCINOMA, RECTUM 03/08/2007   NEPHROLITHIASIS, HX OF 03/08/2007   Past Medical History:  Diagnosis Date   Diverticulosis    Hyperlipidemia    Hypertension    borderline not on meds at this time   Nephrolithiasis    rectal ca dx'd 2005   rectal   Past Surgical History:  Procedure Laterality Date   COLONOSCOPY     enlarged prostate  08/2003   on CT scan   GANGLION CYST EXCISION Right 05/2016   GUM SURGERY  05/2018   cyst removal - benign   LOW ANTERIOR BOWEL RESECTION     Social History   Tobacco Use   Smoking status: Former    Types: Cigarettes   Smokeless tobacco: Never   Tobacco comments:    quit 23+years ago  Vaping Use   Vaping Use: Never used  Substance Use Topics   Alcohol use: Yes  Alcohol/week: 7.0 standard drinks of alcohol    Types: 3 Glasses of wine, 4 Standard drinks or equivalent per week    Comment: daily   Drug use: No   Family History  Problem Relation Age of Onset   Cancer Father 61       colon   Colon cancer Father    Colon polyps Father    Cancer Brother        colon   Colon cancer Brother    Colon polyps Brother    Esophageal cancer Neg Hx    Rectal cancer Neg Hx    Stomach cancer Neg Hx    Allergies  Allergen Reactions   Atorvastatin Other (See Comments)    REACTION: elevated transamines   Pravastatin Sodium Other (See Comments)    REACTION: mood problems/irritability/personality change   Current Outpatient Medications on File Prior to Visit  Medication Sig Dispense Refill   Loperamide HCl (IMODIUM PO) Take by mouth daily as needed.     losartan (COZAAR) 50 MG tablet Take 50 mg by mouth daily.     Multiple Vitamin (MULTIVITAMIN) tablet Take 1 tablet by mouth daily.     naproxen sodium (ALEVE) 220 MG tablet  Take 220 mg by mouth daily as needed.     No current facility-administered medications on file prior to visit.    Review of Systems  Constitutional:  Negative for activity change, appetite change, fatigue, fever and unexpected weight change.  HENT:  Negative for congestion, rhinorrhea, sore throat and trouble swallowing.   Eyes:  Negative for pain, redness, itching and visual disturbance.  Respiratory:  Negative for cough, chest tightness, shortness of breath and wheezing.   Cardiovascular:  Negative for chest pain and palpitations.  Gastrointestinal:  Negative for abdominal pain, blood in stool, constipation, diarrhea and nausea.  Endocrine: Negative for cold intolerance, heat intolerance, polydipsia and polyuria.  Genitourinary:  Negative for difficulty urinating, dysuria, frequency and urgency.  Musculoskeletal:  Negative for arthralgias, joint swelling and myalgias.  Skin:  Negative for pallor and rash.  Neurological:  Negative for dizziness, tremors, weakness, numbness and headaches.  Hematological:  Negative for adenopathy. Does not bruise/bleed easily.  Psychiatric/Behavioral:  Negative for decreased concentration and dysphoric mood. The patient is not nervous/anxious.        Stressors Wife is starting tx for breast cancer       Objective:   Physical Exam Constitutional:      General: He is not in acute distress.    Appearance: Normal appearance. He is well-developed and normal weight. He is not ill-appearing or diaphoretic.  HENT:     Head: Normocephalic and atraumatic.  Eyes:     Conjunctiva/sclera: Conjunctivae normal.     Pupils: Pupils are equal, round, and reactive to light.  Neck:     Thyroid: No thyromegaly.     Vascular: No carotid bruit or JVD.  Cardiovascular:     Rate and Rhythm: Normal rate and regular rhythm.     Heart sounds: Normal heart sounds.     No gallop.  Pulmonary:     Effort: Pulmonary effort is normal. No respiratory distress.     Breath  sounds: Normal breath sounds. No wheezing or rales.  Abdominal:     General: There is no distension or abdominal bruit.     Palpations: Abdomen is soft.  Musculoskeletal:     Cervical back: Normal range of motion and neck supple.     Right lower leg: No edema.  Left lower leg: No edema.  Lymphadenopathy:     Cervical: No cervical adenopathy.  Skin:    General: Skin is warm and dry.     Coloration: Skin is not pale.     Findings: No rash.  Neurological:     Mental Status: He is alert.     Coordination: Coordination normal.     Deep Tendon Reflexes: Reflexes are normal and symmetric. Reflexes normal.  Psychiatric:        Mood and Affect: Mood normal.           Assessment & Plan:   Problem List Items Addressed This Visit       Cardiovascular and Mediastinum   Essential hypertension - Primary    Bp is borderline BP: (!) 141/70  He tried going up on losartan to 100 mg but developed side eff of light headedness (though bp was not low)  Wants to stay on current dose of 50 and not add anything for now  Will continue to work on diet and exercise (low processed food/sodium, consider adding strength training) Then f/u in June for re check  Last labs reviewed        Relevant Medications   losartan (COZAAR) 50 MG tablet     Other   Prediabetes    Lab Results  Component Value Date   HGBA1C 6.7 (H) 05/05/2022   This was after steroid shots in month prior  Doing much better now with low glycemic eating  Will f/u in June Expect improvement Enc exercise as well

## 2022-07-04 ENCOUNTER — Other Ambulatory Visit: Payer: Self-pay | Admitting: Family Medicine

## 2022-07-27 ENCOUNTER — Other Ambulatory Visit: Payer: Self-pay | Admitting: Family Medicine

## 2022-08-08 ENCOUNTER — Ambulatory Visit: Payer: Medicare Other | Admitting: Family Medicine

## 2022-08-09 ENCOUNTER — Encounter: Payer: Self-pay | Admitting: Family Medicine

## 2022-08-09 ENCOUNTER — Ambulatory Visit (INDEPENDENT_AMBULATORY_CARE_PROVIDER_SITE_OTHER): Payer: Medicare Other | Admitting: Family Medicine

## 2022-08-09 VITALS — BP 149/76 | HR 57 | Temp 97.9°F | Ht 71.0 in | Wt 189.4 lb

## 2022-08-09 DIAGNOSIS — R7303 Prediabetes: Secondary | ICD-10-CM | POA: Diagnosis not present

## 2022-08-09 DIAGNOSIS — I10 Essential (primary) hypertension: Secondary | ICD-10-CM

## 2022-08-09 LAB — POCT GLYCOSYLATED HEMOGLOBIN (HGB A1C): Hemoglobin A1C: 5.9 % — AB (ref 4.0–5.6)

## 2022-08-09 MED ORDER — CHLORTHALIDONE 15 MG PO TABS
15.0000 mg | ORAL_TABLET | Freq: Every day | ORAL | 0 refills | Status: DC
Start: 2022-08-09 — End: 2022-08-24

## 2022-08-09 MED ORDER — LOSARTAN POTASSIUM 50 MG PO TABS: 50.0000 mg | ORAL_TABLET | Freq: Every day | ORAL | 1 refills | Status: DC

## 2022-08-09 NOTE — Progress Notes (Signed)
Subjective:    Patient ID: [name], male    DOB: Apr 26, 1950, 72 y.o.   MRN: [id number]  HPI Pt presents for follow up of HTN and prediabetes   Wt Readings from Last 3 Encounters:  08/09/22 189 lb 6 oz (85.9 kg)  06/22/22 190 lb 2 oz (86.2 kg)  06/16/22 188 lb (85.3 kg)   26.41 kg/m  Vitals:   08/09/22 0913 08/09/22 1002  BP: (!) 170/96 (!) 149/76  Pulse: (!) 57   Temp: 97.9 F (36.6 C)   SpO2: 97%     Feeling good  Doing well with diet and exercise   Walking and golf for exercise  Also some floor exercises and weights   HTN BP Readings from Last 3 Encounters:  08/09/22 (!) 149/76  06/22/22 (!) 141/70  06/16/22 111/63      Bp at home  120s to 140s/70s to 80s in am   (one time 154 systolic  In pm tend to run a litle lower    Prediabetes Last A1c went up to 6.7 after several steroid shots    Losartan 50 mg  (the 100 mg dose made him light headed)-but no hypotension  A little light headed with the 50  Lifestyle habits   Lab Results  Component Value Date   HGBA1C 5.9 (A) 08/09/2022   Much improved!   Alcohol intake - 2 per day max (wine)    Patient Active Problem List   Diagnosis Date Noted   Tinnitus 05/12/2022   Episodic peripheral vertigo 05/12/2022   Medicare annual wellness visit, subsequent 11/23/2020   Coronary artery calcification seen on CT scan 03/19/2017   Aortic atherosclerosis (HCC) 03/19/2017   Essential hypertension 06/06/2016   Elevated PSA 06/16/2011   Encounter for general adult medical examination with abnormal findings 06/16/2011   Prediabetes 06/14/2011   Prostate cancer screening 06/14/2011   Hyperlipidemia 09/08/2008   ADENOCARCINOMA, RECTUM 03/08/2007   NEPHROLITHIASIS, HX OF 03/08/2007   Past Medical History:  Diagnosis Date   Diverticulosis    Hyperlipidemia    Hypertension    borderline not on meds at this time   Nephrolithiasis    rectal ca dx'd 2005   rectal   Past Surgical History:  Procedure  Laterality Date   COLONOSCOPY     enlarged prostate  08/2003   on CT scan   GANGLION CYST EXCISION Right 05/2016   GUM SURGERY  05/2018   cyst removal - benign   LOW ANTERIOR BOWEL RESECTION     Social History   Tobacco Use   Smoking status: Former    Types: Cigarettes   Smokeless tobacco: Never   Tobacco comments:    quit 23+years ago  Vaping Use   Vaping Use: Never used  Substance Use Topics   Alcohol use: Yes    Alcohol/week: 7.0 standard drinks of alcohol    Types: 3 Glasses of wine, 4 Standard drinks or equivalent per week    Comment: daily   Drug use: No   Family History  Problem Relation Age of Onset   Cancer Father 70       colon   Colon cancer Father    Colon polyps Father    Cancer Brother        colon   Colon cancer Brother    Colon polyps Brother    Esophageal cancer Neg Hx    Rectal cancer Neg Hx    Stomach cancer Neg Hx    Allergies  Allergen  Reactions   Atorvastatin Other (See Comments)    REACTION: elevated transamines   Pravastatin Sodium Other (See Comments)    REACTION: mood problems/irritability/personality change   Current Outpatient Medications on File Prior to Visit  Medication Sig Dispense Refill   Loperamide HCl (IMODIUM PO) Take by mouth daily as needed.     Multiple Vitamin (MULTIVITAMIN) tablet Take 1 tablet by mouth daily.     naproxen sodium (ALEVE) 220 MG tablet Take 220 mg by mouth daily as needed.     No current facility-administered medications on file prior to visit.     Review of Systems  Constitutional:  Negative for activity change, appetite change, chills, fatigue, fever and unexpected weight change.  HENT:  Negative for congestion, ear pain, rhinorrhea, sinus pain, sore throat and trouble swallowing.   Eyes:  Negative for pain, discharge, redness, itching and visual disturbance.  Respiratory:  Negative for cough, chest tightness, shortness of breath, wheezing and stridor.   Cardiovascular:  Negative for chest pain,  palpitations and leg swelling.  Gastrointestinal:  Negative for abdominal pain, blood in stool, constipation, diarrhea, nausea and vomiting.  Endocrine: Negative for cold intolerance, heat intolerance, polydipsia and polyuria.  Genitourinary:  Negative for difficulty urinating, dysuria, frequency and urgency.  Musculoskeletal:  Negative for arthralgias, joint swelling and myalgias.  Skin:  Negative for pallor and rash.  Neurological:  Negative for dizziness, tremors, weakness, numbness and headaches.       Occ light headed   Hematological:  Negative for adenopathy. Does not bruise/bleed easily.  Psychiatric/Behavioral:  Negative for decreased concentration and dysphoric mood. The patient is not nervous/anxious.        Objective:   Physical Exam Constitutional:      General: He is not in acute distress.    Appearance: Normal appearance. He is well-developed and normal weight. He is not ill-appearing or diaphoretic.  HENT:     Head: Normocephalic and atraumatic.  Eyes:     Conjunctiva/sclera: Conjunctivae normal.     Pupils: Pupils are equal, round, and reactive to light.  Neck:     Thyroid: No thyromegaly.     Vascular: No carotid bruit or JVD.  Cardiovascular:     Rate and Rhythm: Normal rate and regular rhythm.     Heart sounds: Normal heart sounds.     No gallop.  Pulmonary:     Effort: Pulmonary effort is normal. No respiratory distress.     Breath sounds: Normal breath sounds. No wheezing or rales.  Abdominal:     General: There is no distension or abdominal bruit.     Palpations: Abdomen is soft.  Musculoskeletal:     Cervical back: Normal range of motion and neck supple.     Right lower leg: No edema.     Left lower leg: No edema.  Lymphadenopathy:     Cervical: No cervical adenopathy.  Skin:    General: Skin is warm and dry.     Coloration: Skin is not pale.     Findings: No rash.  Neurological:     Mental Status: He is alert.     Coordination: Coordination  normal.     Deep Tendon Reflexes: Reflexes are normal and symmetric. Reflexes normal.  Psychiatric:        Mood and Affect: Mood normal.           Assessment & Plan:   Problem List Items Addressed This Visit       Cardiovascular and Mediastinum  Essential hypertension - Primary    Some whitecoat component BP: (!) 149/76  Lower at home than here  Tolerates losartan 50 mg daily  Will try chlorthalidone 15 mg daily and f/u 2 wk for visit with lab Discussed lifestyle change in detail   Instructed to call if any side effects or problems        Relevant Medications   chlorthalidone (THALITONE) 15 MG tablet   losartan (COZAAR) 50 MG tablet     Other   Prediabetes    Lab Results  Component Value Date   HGBA1C 5.9 (A) 08/09/2022  Significant improvement (no recent steroids) disc imp of low glycemic diet and wt loss to prevent DM2       Relevant Orders   POCT HgB A1C (Completed)

## 2022-08-09 NOTE — Assessment & Plan Note (Signed)
Lab Results  Component Value Date   HGBA1C 5.9 (A) 08/09/2022  Significant improvement (no recent steroids) disc imp of low glycemic diet and wt loss to prevent DM2

## 2022-08-09 NOTE — Assessment & Plan Note (Signed)
Some whitecoat component BP: (!) 149/76  Lower at home than here  Tolerates losartan 50 mg daily  Will try chlorthalidone 15 mg daily and f/u 2 wk for visit with lab Discussed lifestyle change in detail   Instructed to call if any side effects or problems

## 2022-08-09 NOTE — Patient Instructions (Addendum)
For prediabets (improved) Try to get most of your carbohydrates from produce (with the exception of white potatoes)  Eat less bread/pasta/rice/snack foods/cereals/sweets and other items from the middle of the grocery store (processed carbs)  Keep working on exercise   Take care of yourself   Continue the losartan 50 mg daily  Add chlorthalidone 15 mg daily in the morning (low dose)  If any side effects or problems     Keep watching your blood pressure  Bring log and you cuff to the next visit in 2 weeks

## 2022-08-24 ENCOUNTER — Ambulatory Visit: Payer: Medicare Other | Admitting: Family Medicine

## 2022-08-24 ENCOUNTER — Ambulatory Visit (INDEPENDENT_AMBULATORY_CARE_PROVIDER_SITE_OTHER): Payer: Medicare Other | Admitting: Family Medicine

## 2022-08-24 ENCOUNTER — Encounter: Payer: Self-pay | Admitting: Family Medicine

## 2022-08-24 VITALS — BP 148/80 | HR 89 | Temp 97.8°F | Ht 71.0 in | Wt 184.5 lb

## 2022-08-24 DIAGNOSIS — R55 Syncope and collapse: Secondary | ICD-10-CM | POA: Insufficient documentation

## 2022-08-24 DIAGNOSIS — I10 Essential (primary) hypertension: Secondary | ICD-10-CM

## 2022-08-24 DIAGNOSIS — R000 Tachycardia, unspecified: Secondary | ICD-10-CM | POA: Diagnosis not present

## 2022-08-24 NOTE — Progress Notes (Signed)
Subjective:    Patient ID: [name], male    DOB: 28-Oct-1950, 72 y.o.   MRN: [id number]  HPI  Wt Readings from Last 3 Encounters:  08/24/22 184 lb 8 oz (83.7 kg)  08/09/22 189 lb 6 oz (85.9 kg)  06/22/22 190 lb 2 oz (86.2 kg)   25.73 kg/m  Vitals:   08/24/22 0847 08/24/22 0915  BP: (!) 156/84 (!) 148/80  Pulse: 89   Temp: 97.8 F (36.6 C)   SpO2: 100%    Pt presents for follow up of HTN and also follow up of ER visit for syncope with L4 fracture    Was seen in ED at [facility] on 6/24 He had syncopal episode after donating blood and working outside for most of the day  Stood up / had light headedness and tunnel vision , with diarrhea and vomiting   Did have L4 compression fracture on imaging- unsure if this was new   Reassuring work up in bp was 158.69 Pulse 91 Pulse ox 96%R Normal EKG  Was hydrated  Hb did go down with hydration   CT LS IMPRESSION:  1. Mild acute anterior wedge fracture L4 vertebral body limited to the anterior and superior vertebral body with approximate 15-20% loss of vertebral body height 2. Please reference the CT of the abdomen and pelvis for characterization of all structures outside the lumbar spine.   CT head  IMPRESSION: CONCLUSION:  1. No acute intracranial abnormality. 2. Specifically no acute posttraumatic findings.    Now  Muscles in back are sore     HTN . BP Readings from Last 3 Encounters:  08/24/22 (!) 148/80  08/09/22 (!) 149/76  06/22/22 (!) 141/70    Losartan 50 mg daily  Chlorthalidone 15 mg daily -new 2 wk ago  Has felt light headed ever since starting both medicines   (very mild just when he stands)   Resting heart rate is staying high recently also   Blood pressure at home is below 130s over 60s-70s   Feels light headed   Lab Results  Component Value Date   NA 135 05/05/2022   K 4.5 05/05/2022   CO2 24 05/05/2022   GLUCOSE 226 (H) 05/05/2022   BUN 9 05/05/2022   CREATININE 1.08  05/05/2022   CALCIUM 10.0 05/05/2022   GFR 76.84 11/23/2020   EGFR 73 05/05/2022   GFRNONAA >60 11/15/2016     Patient Active Problem List   Diagnosis Date Noted   Syncope 08/24/2022   Increased heart rate 08/24/2022   Tinnitus 05/12/2022   Episodic peripheral vertigo 05/12/2022   Medicare annual wellness visit, subsequent 11/23/2020   Coronary artery calcification seen on CT scan 03/19/2017   Aortic atherosclerosis (HCC) 03/19/2017   Essential hypertension 06/06/2016   Elevated PSA 06/16/2011   Encounter for general adult medical examination with abnormal findings 06/16/2011   Prediabetes 06/14/2011   Prostate cancer screening 06/14/2011   Hyperlipidemia 09/08/2008   ADENOCARCINOMA, RECTUM 03/08/2007   NEPHROLITHIASIS, HX OF 03/08/2007   Past Medical History:  Diagnosis Date   Diverticulosis    Hyperlipidemia    Hypertension    borderline not on meds at this time   Nephrolithiasis    rectal ca dx'd 2005   rectal   Past Surgical History:  Procedure Laterality Date   COLONOSCOPY     enlarged prostate  08/2003   on CT scan   GANGLION CYST EXCISION Right 05/2016   GUM SURGERY  05/2018   cyst  removal - benign   LOW ANTERIOR BOWEL RESECTION     Social History   Tobacco Use   Smoking status: Former    Types: Cigarettes   Smokeless tobacco: Never   Tobacco comments:    quit 23+years ago  Vaping Use   Vaping Use: Never used  Substance Use Topics   Alcohol use: Yes    Alcohol/week: 7.0 standard drinks of alcohol    Types: 3 Glasses of wine, 4 Standard drinks or equivalent per week    Comment: daily   Drug use: No   Family History  Problem Relation Age of Onset   Cancer Father 66       colon   Colon cancer Father    Colon polyps Father    Cancer Brother        colon   Colon cancer Brother    Colon polyps Brother    Esophageal cancer Neg Hx    Rectal cancer Neg Hx    Stomach cancer Neg Hx    Allergies  Allergen Reactions   Atorvastatin Other (See  Comments)    REACTION: elevated transamines   Pravastatin Sodium Other (See Comments)    REACTION: mood problems/irritability/personality change   Current Outpatient Medications on File Prior to Visit  Medication Sig Dispense Refill   Loperamide HCl (IMODIUM PO) Take by mouth daily as needed.     losartan (COZAAR) 50 MG tablet Take 1 tablet (50 mg total) by mouth daily. 90 tablet 1   Multiple Vitamin (MULTIVITAMIN) tablet Take 1 tablet by mouth daily.     naproxen sodium (ALEVE) 220 MG tablet Take 220 mg by mouth daily as needed.     No current facility-administered medications on file prior to visit.    Review of Systems  Constitutional:  Negative for activity change, appetite change, fatigue, fever and unexpected weight change.  HENT:  Negative for congestion, rhinorrhea, sore throat and trouble swallowing.   Eyes:  Negative for pain, redness, itching and visual disturbance.  Respiratory:  Negative for cough, chest tightness, shortness of breath and wheezing.   Cardiovascular:  Negative for chest pain and palpitations.  Gastrointestinal:  Negative for abdominal pain, blood in stool, constipation, diarrhea and nausea.  Endocrine: Negative for cold intolerance, heat intolerance, polydipsia and polyuria.  Genitourinary:  Negative for difficulty urinating, dysuria, frequency and urgency.  Musculoskeletal:  Negative for arthralgias, joint swelling and myalgias.  Skin:  Negative for pallor and rash.  Neurological:  Positive for syncope. Negative for dizziness, tremors, weakness, numbness and headaches.  Hematological:  Negative for adenopathy. Does not bruise/bleed easily.  Psychiatric/Behavioral:  Negative for decreased concentration and dysphoric mood. The patient is not nervous/anxious.        Objective:   Physical Exam Constitutional:      General: He is not in acute distress.    Appearance: Normal appearance. He is well-developed and normal weight. He is not ill-appearing or  diaphoretic.  HENT:     Head: Normocephalic and atraumatic.     Mouth/Throat:     Mouth: Mucous membranes are moist.  Eyes:     General: No scleral icterus.    Conjunctiva/sclera: Conjunctivae normal.     Pupils: Pupils are equal, round, and reactive to light.  Neck:     Thyroid: No thyromegaly.     Vascular: No carotid bruit or JVD.  Cardiovascular:     Rate and Rhythm: Normal rate and regular rhythm.     Heart sounds: Normal heart sounds.  No gallop.  Pulmonary:     Effort: Pulmonary effort is normal. No respiratory distress.     Breath sounds: Normal breath sounds. No wheezing or rales.  Abdominal:     General: There is no distension or abdominal bruit.     Palpations: Abdomen is soft.  Musculoskeletal:     Cervical back: Normal range of motion and neck supple.     Right lower leg: No edema.     Left lower leg: No edema.  Lymphadenopathy:     Cervical: No cervical adenopathy.  Skin:    General: Skin is warm and dry.     Coloration: Skin is not pale.     Findings: No rash.  Neurological:     Mental Status: He is alert.     Coordination: Coordination normal.     Deep Tendon Reflexes: Reflexes are normal and symmetric. Reflexes normal.  Psychiatric:        Mood and Affect: Mood normal.           Assessment & Plan:   Problem List Items Addressed This Visit       Cardiovascular and Mediastinum   Essential hypertension - Primary    With white coat syndrome BP: (!) 148/80  At home runs in 130s/ 70s much of the time Will hold chlorthalidone in light of recent syncope Continue losartan 50 mg daily  Resting HR is higher than in the past   Cardiology referral done for further eval      Relevant Orders   Ambulatory referral to Cardiology     Other   Syncope    Vasovagal after working outside / blood donatoin and dehydration  ER records reviewed Reviewed hospital records, lab results and studies in detail   Recovered now  Holding chlorthalldone Pulse  rate remains higher than baseline Referral done to cardiology       Relevant Orders   Ambulatory referral to Cardiology   Increased heart rate    Ref to cardiology  Higher than his baseline ever since his syncopal episode       Relevant Orders   Ambulatory referral to Cardiology

## 2022-08-24 NOTE — Assessment & Plan Note (Signed)
With white coat syndrome BP: (!) 148/80  At home runs in 130s/ 70s much of the time Will hold chlorthalidone in light of recent syncope Continue losartan 50 mg daily  Resting HR is higher than in the past   Cardiology referral done for further eval

## 2022-08-24 NOTE — Assessment & Plan Note (Signed)
Vasovagal after working outside / blood donatoin and dehydration  ER records reviewed Reviewed hospital records, lab results and studies in detail   Recovered now  Holding chlorthalldone Pulse rate remains higher than baseline Referral done to cardiology

## 2022-08-24 NOTE — Assessment & Plan Note (Signed)
Ref to cardiology  Higher than his baseline ever since his syncopal episode

## 2022-08-24 NOTE — Patient Instructions (Addendum)
Hold the chlorthalidone Continue the losartan   I want you to check in with cardiology regarding   Keep an eye on blood pressure and pulse   I placed a referral to cardiology   Please let us know if you don't hear in 1-2 weeks   In the future I would like to investigate bone density    Make sure to get 2000 international units of vitamin D3 per day for bone health

## 2022-09-26 NOTE — Progress Notes (Signed)
Cardiology Office Note  Date:  09/27/2022   ID:  [name], DOB Nov 01, 1950, MRN [id number]  PCP:  Judy Pimple, MD   Chief Complaint  Patient presents with   New Patient (Initial Visit)    Ref by Dr. Milinda Antis for HTN & vasovagal syncope. Patient c/o dizziness/lightheaded, racing heart beats, shortness of breath with walking up an incline. Medications reviewed by the patient verbally.     HPI:  [name] is a 72 yo male with past medical history of Smoker, 20 yr Hyperlipidemia HTN Rectal cancer, s/p resection Pravastatin and atorvastatin intolerance with elevated LFTs  Who presents by referral from Dr. Milinda Antis for consultation of syncope, hypertension with symptoms of lightheadedness, elevated heart rate  Last seen by myself in clinic January 2019  Lab work reviewed Total cholesterol 315 LDL 212 A1C 5.9    seen in ED at [facility] on 6/24 He had syncopal episode after donating blood and working outside for most of the day  Stood up / had light headedness and tunnel vision , with diarrhea and vomiting  Seen in hospital[facility], Mild acute anterior wedge fracture L4 vertebral body  Potassium 3.2   /Blood pressure On losartan 50 daily in the PM Tried chlorthalidone, helped blood pressure, caused increased heart rate 90 -110 Stopped chlorthalidone  Home blood pressure Am: 155 systolic in AM Lower in the evening: 120-130 systolic  Shortness of breath, chest tightness walking hills Concerned about baseline tachycardia  Blood pressure remains elevated on recheck 160 systolic over 95  EKG personally reviewed by myself on todays visit EKG Interpretation Date/Time:  Tuesday September 27 2022 09:18:02 EDT Ventricular Rate:  98 PR Interval:  126 QRS Duration:  90 QT Interval:  344 QTC Calculation: 439 R Axis:   27  Text Interpretation: Normal sinus rhythm Normal ECG When compared with ECG of 15-Nov-2016 06:20, No significant change was found Confirmed by Julien Nordmann  [id number]) on 09/27/2022 9:33:15 AM   Medical history reviewed    Discussed statin use in the past Tried pravastatin and Lipitor (elevated LFTS), gave him emotional side effects/depression and anx    CT ABD: 03/2012:  Coronary athero CT chest 03/2012  Atherosclerotic calcifications of the abdominal aorta and branch Vessels.   Images above pulled up in detail in the office and reviewed   PMH:   has a past medical history of Diverticulosis, Hyperlipidemia, Hypertension, Nephrolithiasis, and rectal ca (dx'd 2005).  PSH:    Past Surgical History:  Procedure Laterality Date   COLONOSCOPY     enlarged prostate  08/2003   on CT scan   GANGLION CYST EXCISION Right 05/2016   GUM SURGERY  05/2018   cyst removal - benign   LOW ANTERIOR BOWEL RESECTION      Current Outpatient Medications  Medication Sig Dispense Refill   Cholecalciferol 125 MCG (5000 UT) TABS Take 5,000 Units by mouth daily.     Loperamide HCl (IMODIUM PO) Take by mouth daily as needed.     losartan (COZAAR) 50 MG tablet Take 1 tablet (50 mg total) by mouth daily. 90 tablet 1   Multiple Vitamin (MULTIVITAMIN) tablet Take 1 tablet by mouth daily.     naproxen sodium (ALEVE) 220 MG tablet Take 220 mg by mouth daily as needed.     No current facility-administered medications for this visit.     Allergies:   Atorvastatin, Pravastatin sodium, and Pravastatin sodium   Social History:  The patient  reports that he has quit  smoking. His smoking use included cigarettes. He has never used smokeless tobacco. He reports current alcohol use of about 7.0 standard drinks of alcohol per week. He reports that he does not use drugs.   Family History:   family history includes Cancer in his brother; Cancer (age of onset: 95) in his father; Colon cancer in his brother and father; Colon polyps in his brother and father.    Review of Systems: Review of Systems  Constitutional: Negative.   HENT: Negative.    Respiratory: Negative.     Cardiovascular:  Positive for chest pain.  Gastrointestinal: Negative.   Musculoskeletal: Negative.   Neurological: Negative.   Psychiatric/Behavioral: Negative.    All other systems reviewed and are negative.    PHYSICAL EXAM: VS:  BP (!) 150/90 (BP Location: Right Arm, Patient Position: Sitting, Cuff Size: Normal)   Pulse 98   Ht 6' (1.829 m)   Wt 185 lb 6 oz (84.1 kg)   SpO2 98%   BMI 25.14 kg/m  , BMI Body mass index is 25.14 kg/m. GEN: Well nourished, well developed, in no acute distress HEENT: normal Neck: no JVD, carotid bruits, or masses Cardiac: RRR; no murmurs, rubs, or gallops,no edema  Respiratory:  clear to auscultation bilaterally, normal work of breathing GI: soft, nontender, nondistended, + BS MS: no deformity or atrophy Skin: warm and dry, no rash Neuro:  Strength and sensation are intact Psych: euthymic mood, full affect   Recent Labs: 05/05/2022: ALT 25; BUN 9; Creatinine, Ser 1.08; Hemoglobin 15.3; Platelets 297; Potassium 4.5; Sodium 135; TSH 1.630    Lipid Panel Lab Results  Component Value Date   CHOL 315 (H) 05/05/2022   HDL 46 05/05/2022   LDLCALC 212 (H) 05/05/2022   TRIG 283 (H) 05/05/2022      Wt Readings from Last 3 Encounters:  09/27/22 185 lb 6 oz (84.1 kg)  08/24/22 184 lb 8 oz (83.7 kg)  08/09/22 189 lb 6 oz (85.9 kg)      ASSESSMENT AND PLAN:  Problem List Items Addressed This Visit       Cardiology Problems   Essential hypertension   Relevant Orders   EKG 12-Lead (Completed)   Hyperlipidemia   Coronary artery calcification seen on CT scan   Relevant Orders   EKG 12-Lead (Completed)   Aortic atherosclerosis (HCC) - Primary   Relevant Orders   EKG 12-Lead (Completed)     Other   Syncope   Relevant Orders   EKG 12-Lead (Completed)    Stress/angina We have recommended cardiac CTA Risk factors include hyperlipidemia, prior smoking history Metoprolol tartrate and Corlanor prior to admission Echocardiogram  pending  Essential hypertension Blood pressure running high , suggested losartan 50 twice daily Could also consider amlodipine Beta-blocker offered, he prefers less medication at this time  Hyperlipidemia Cardiac CTA as above to help guide management Previous intolerance of statins Could consider PCSK9 inhibitor  Aortic atherosclerosis Management as above, CT pending, consider management with Repatha/Praluent  Vasovagal syncope Possibly exacerbated after working in the yard, poor p.o. intake, giving blood, Denies further episodes Echocardiogram pending   Total encounter time more than 60 minutes  Greater than 50% was spent in counseling and coordination of care with the patient    Signed, Dossie Arbour, M.D., Ph.D. [facility] [place], [place] [phone]

## 2022-09-27 ENCOUNTER — Encounter: Payer: Self-pay | Admitting: Cardiovascular Disease

## 2022-09-27 ENCOUNTER — Ambulatory Visit: Payer: Medicare Other | Attending: Cardiovascular Disease | Admitting: Cardiovascular Disease

## 2022-09-27 VITALS — BP 150/90 | HR 98 | Ht 72.0 in | Wt 185.4 lb

## 2022-09-27 DIAGNOSIS — I209 Angina pectoris, unspecified: Secondary | ICD-10-CM

## 2022-09-27 DIAGNOSIS — I2510 Atherosclerotic heart disease of native coronary artery without angina pectoris: Secondary | ICD-10-CM

## 2022-09-27 DIAGNOSIS — R0602 Shortness of breath: Secondary | ICD-10-CM

## 2022-09-27 DIAGNOSIS — R55 Syncope and collapse: Secondary | ICD-10-CM

## 2022-09-27 DIAGNOSIS — I25119 Atherosclerotic heart disease of native coronary artery with unspecified angina pectoris: Secondary | ICD-10-CM | POA: Diagnosis not present

## 2022-09-27 DIAGNOSIS — Z79899 Other long term (current) drug therapy: Secondary | ICD-10-CM

## 2022-09-27 DIAGNOSIS — I4711 Inappropriate sinus tachycardia, so stated: Secondary | ICD-10-CM

## 2022-09-27 DIAGNOSIS — E782 Mixed hyperlipidemia: Secondary | ICD-10-CM

## 2022-09-27 DIAGNOSIS — I10 Essential (primary) hypertension: Secondary | ICD-10-CM

## 2022-09-27 DIAGNOSIS — I700 Atherosclerosis of aorta: Secondary | ICD-10-CM

## 2022-09-27 MED ORDER — IVABRADINE HCL 5 MG PO TABS: 10.0000 mg | ORAL_TABLET | Freq: Once | ORAL | Status: AC

## 2022-09-27 MED ORDER — METOPROLOL TARTRATE 100 MG PO TABS: 100.0000 mg | ORAL_TABLET | Freq: Once | ORAL | 0 refills | Status: DC

## 2022-09-27 MED ORDER — LOSARTAN POTASSIUM 50 MG PO TABS: 50.0000 mg | ORAL_TABLET | Freq: Two times a day (BID) | ORAL | Status: DC

## 2022-09-27 NOTE — Patient Instructions (Addendum)
Medication Instructions:  Ok to try losartan 50 mg twice a day   If you need a refill on your cardiac medications before your next appointment, please call your pharmacy.   Lab work: Your provider would like for you to return within 30 days of your CTA to have the following labs drawn: BMP.   Please go to [facility] [address] ([facility] [address], [place] [address] You do not need an appointment.  They are open from 7:30 am-4 pm.  Lunch from 1:00 pm- 2:00 pm    You may also go to any of these [facility] locations:  [employer]  - [address] [employer] [address] ([facility])      Testing/Procedures: Echocardiogram for tachycardia Cardiac CTA   Your physician has requested that you have an echocardiogram. Echocardiography is a painless test that uses sound waves to create images of your heart. It provides your doctor with information about the size and shape of your heart and how well your heart's chambers and valves are working.   You may receive an ultrasound enhancing agent through an IV if needed to better visualize your heart during the echo. This procedure takes approximately one hour.  There are no restrictions for this procedure.  This will take place at [address] ([facility], [place] [address]   Your physician has requested that you have cardiac CT. Cardiac computed tomography (CT) is a painless test that uses an x-ray machine to take clear, detailed pictures of your heart. For further information please visit [email or web address] Please follow instruction sheet as given.     Your cardiac CT will be scheduled at one of the below locations:    [facility] [address] [place], [place] [address] [phone]  OR   [facility] [address] [place], [place] [address] [phone]    If scheduled at [facility] or [facility], please arrive 15 mins early for check-in and test prep.  There is spacious parking and easy access to the radiology department from the [facility] entrance. Please enter here and check-in with the desk attendant.   Please follow these instructions carefully (unless otherwise directed):  An IV will be required for this test and Nitroglycerin will be given.  Hold all erectile dysfunction medications at least 3 days (72 hrs) prior to test. (Ie viagra, cialis, sildenafil, tadalafil, etc)   On the Night Before the Test: Be sure to Drink plenty of water. Do not consume any caffeinated/decaffeinated beverages or chocolate 12 hours prior to your test. Do not take any antihistamines 12 hours prior to your test.  On the Day of the Test: Drink plenty of water until 1 hour prior to the test. Do not eat any food 1 hour prior to test. You may take your regular medications prior to the test.  Take metoprolol tartrate (Lopressor) 100 mg and Corlanor 10 mg (2 tablets) two hours prior to test. If you take Furosemide/Hydrochlorothiazide/Spironolactone, please HOLD on the morning of the test.      After the Test: Drink plenty of water. After receiving IV contrast, you may experience a mild flushed feeling. This is normal. On occasion, you may experience a mild rash up to 24 hours after the test. This is not dangerous. If this occurs, you can take Benadryl 25 mg and increase your fluid intake. If you experience trouble breathing, this  can be serious. If it is severe call 911 IMMEDIATELY. If it is mild, please call our office. If you take any of these medications: Glipizide/Metformin, Avandament, Glucavance, please do not take 48 hours after completing test unless otherwise instructed.  We will call to schedule your test 2-4 weeks out understanding that some insurance companies will need an authorization prior to the service being performed.    For more information and frequently asked questions, please visit our website : [email or web address]  For non-scheduling related questions, please contact the cardiac imaging nurse navigator should you have any questions/concerns: Cardiac Imaging Nurse Navigators Direct Office Dial: [phone]   For scheduling needs, including cancellations and rescheduling, please call [place], [phone].   Follow-Up: At [facility], you and your health needs are our priority.  As part of our continuing mission to provide you with exceptional heart care, we have created designated Provider Care Teams.  These Care Teams include your primary Cardiologist (physician) and Advanced Practice Providers (APPs -  Physician Assistants and Nurse Practitioners) who all work together to provide you with the care you need, when you need it.  You will need a follow up appointment in 12 months  Providers on your designated Care Team:   Nicolasa Ducking, NP Eula Listen, PA-C Cadence Fransico Michael, [place]  COVID-19 Vaccine Information can be found at: [email or web address] For questions related to vaccine distribution or appointments, please email [email or web address] or call [phone].

## 2022-10-14 ENCOUNTER — Other Ambulatory Visit: Payer: Medicare Other

## 2022-10-18 ENCOUNTER — Telehealth: Payer: Self-pay | Admitting: Family Medicine

## 2022-10-18 MED ORDER — LOSARTAN POTASSIUM 50 MG PO TABS: 50.0000 mg | ORAL_TABLET | Freq: Two times a day (BID) | ORAL | 1 refills | Status: DC

## 2022-10-18 NOTE — Telephone Encounter (Signed)
Prescription Request  10/18/2022  LOV: 08/24/2022  What is the name of the medication or equipment? losarton  Have you contacted your pharmacy to request a refill? Yes   Which pharmacy would you like this sent to?  [id number] [place], [place] - [address] AT NEXT TO [facility] [address] Nad[place]ard [place] [address] Phone: [phone] Fax: [phone]    Patient notified that their request is being sent to the clinical staff for review and that they should receive a response within 2 business days.   Please advise at Mobile [phone] (mobile)

## 2022-10-18 NOTE — Telephone Encounter (Signed)
Looks like Dr. Mariah Milling updated his med (Losartan) but didn't send an Rx in on 09/27/22, will route to PCP to see if it's okay to refill med or if cardiology needs to fill med, please advise   HTN f/u was on 08/24/22

## 2022-10-18 NOTE — Telephone Encounter (Signed)
I sent it  

## 2022-10-20 ENCOUNTER — Encounter (HOSPITAL_COMMUNITY): Payer: Self-pay

## 2022-10-24 ENCOUNTER — Other Ambulatory Visit: Payer: Self-pay | Admitting: Cardiology

## 2022-10-24 ENCOUNTER — Ambulatory Visit
Admission: RE | Admit: 2022-10-24 | Discharge: 2022-10-24 | Disposition: A | Discharge status: Home / Self Care | Payer: Self-pay | Source: Ambulatory Visit | Attending: Cardiology | Admitting: Cardiology

## 2022-10-24 ENCOUNTER — Ambulatory Visit
Admission: RE | Admit: 2022-10-24 | Discharge: 2022-10-24 | Disposition: A | Discharge status: Home / Self Care | Payer: Medicare Other | Source: Ambulatory Visit | Attending: Cardiovascular Disease | Admitting: Cardiovascular Disease

## 2022-10-24 ENCOUNTER — Ambulatory Visit: Payer: Medicare Other | Attending: Cardiovascular Disease

## 2022-10-24 DIAGNOSIS — I4711 Inappropriate sinus tachycardia, so stated: Secondary | ICD-10-CM | POA: Diagnosis not present

## 2022-10-24 DIAGNOSIS — R931 Abnormal findings on diagnostic imaging of heart and coronary circulation: Secondary | ICD-10-CM

## 2022-10-24 DIAGNOSIS — I5030 Unspecified diastolic (congestive) heart failure: Secondary | ICD-10-CM | POA: Diagnosis not present

## 2022-10-24 DIAGNOSIS — R0602 Shortness of breath: Secondary | ICD-10-CM

## 2022-10-24 DIAGNOSIS — I2510 Atherosclerotic heart disease of native coronary artery without angina pectoris: Secondary | ICD-10-CM | POA: Insufficient documentation

## 2022-10-24 LAB — ECHOCARDIOGRAM COMPLETE
AR max vel: 2.86 cm2
AV Area VTI: 2.72 cm2
AV Area mean vel: 2.88 cm2
AV Mean grad: 6 mmHg
AV Peak grad: 11.6 mmHg
Ao pk vel: 1.7 m/s
Area-P 1/2: 3.6 cm2
Calc EF: 54.8 %
S' Lateral: 2.8 cm
Single Plane A2C EF: 53.3 %
Single Plane A4C EF: 56.5 %

## 2022-10-24 MED ORDER — IOHEXOL 350 MG/ML SOLN
80.0000 mL | Freq: Once | INTRAVENOUS | Status: AC | PRN
  Administered 2022-10-24: 80 mL via INTRAVENOUS

## 2022-10-24 MED ORDER — NITROGLYCERIN 0.4 MG SL SUBL
0.8000 mg | SUBLINGUAL_TABLET | Freq: Once | SUBLINGUAL | Status: AC
  Administered 2022-10-24: 0.8 mg via SUBLINGUAL
  Filled 2022-10-24: qty 25

## 2022-10-24 NOTE — Progress Notes (Signed)

## 2022-11-01 ENCOUNTER — Telehealth: Payer: Self-pay | Admitting: Cardiovascular Disease

## 2022-11-01 DIAGNOSIS — E782 Mixed hyperlipidemia: Secondary | ICD-10-CM

## 2022-11-01 NOTE — Telephone Encounter (Signed)
Patient states he is returning call. Stated that he got disconnected. Please advise

## 2022-11-01 NOTE — Telephone Encounter (Signed)
Pt with multiple questions regarding repatha and stated he would like to be referred to pharm D for educations    Antonieta Iba, MD 10/31/2022  1:15 PM EDT     Cardiac CTA Moderate coronary calcium, score 464 Nonobstructive disease noted Moderate stenosis of the mid left circumflex and proximal RCA estimated at 50 to 69% Mild stenosis proximal  LAD   Images have been sent out for further processing If no significant change on final evaluation, no further testing needed at this time Given moderate disease, no indication for catheterization   For hyperlipidemia, given prior statin intolerance, would start injectable Repatha 140 mg subcu every 2 weeks

## 2022-11-01 NOTE — Telephone Encounter (Signed)
Left a message for the patient to call back.  

## 2022-11-01 NOTE — Telephone Encounter (Signed)
Patient is returning call in regards to results. Requesting return call.  

## 2022-11-03 ENCOUNTER — Telehealth: Payer: Self-pay | Admitting: Cardiovascular Disease

## 2022-11-03 NOTE — Telephone Encounter (Signed)
Patient states he refused a referral because he just had questions regarding the side effects and if he wants to take the medication. Patient states he lives too far to come into the office just to discuss this. Patient states he will speak with his local pharmacist and if he has further questions he will reach out to our office.

## 2022-11-03 NOTE — Telephone Encounter (Signed)
Pt would like a callback regarding referral. Pt states that he just wanted to ask questions regarding Repatha medication. He stated that he did not want to have to come into the office in order to do this. Please advise

## 2023-03-16 ENCOUNTER — Telehealth: Payer: Medicare Other | Admitting: Physician Assistant

## 2023-03-16 DIAGNOSIS — B9689 Other specified bacterial agents as the cause of diseases classified elsewhere: Secondary | ICD-10-CM | POA: Diagnosis not present

## 2023-03-16 DIAGNOSIS — J069 Acute upper respiratory infection, unspecified: Secondary | ICD-10-CM | POA: Diagnosis not present

## 2023-03-16 MED ORDER — BENZONATATE 100 MG PO CAPS: 100.0000 mg | ORAL_CAPSULE | Freq: Three times a day (TID) | ORAL | 0 refills | Status: DC | PRN

## 2023-03-16 MED ORDER — AZITHROMYCIN 250 MG PO TABS: ORAL_TABLET | ORAL | 0 refills | Status: AC

## 2023-03-16 MED ORDER — FLUTICASONE PROPIONATE 50 MCG/ACT NA SUSP: 2.0000 | Freq: Every day | NASAL | 0 refills | Status: DC

## 2023-03-16 NOTE — Patient Instructions (Signed)
[name], thank you for joining Piedad Climes, PA-C for today's virtual visit.  While this provider is not your primary care provider (PCP), if your PCP is located in our provider database this encounter information will be shared with them immediately following your visit.   A [facility] MyChart account gives you access to today's visit and all your visits, tests, and labs performed at [facility] " click here if you don't have a [facility] MyChart account or go to [email or web address]  Consent: (Patient) [name] provided verbal consent for this virtual visit at the beginning of the encounter.  Current Medications:  Current Outpatient Medications:    Cholecalciferol 125 MCG (5000 UT) TABS, Take 5,000 Units by mouth daily., Disp: , Rfl:    Loperamide HCl (IMODIUM PO), Take by mouth daily as needed., Disp: , Rfl:    losartan (COZAAR) 50 MG tablet, Take 1 tablet (50 mg total) by mouth in the morning and at bedtime., Disp: 180 tablet, Rfl: 1   metoprolol tartrate (LOPRESSOR) 100 MG tablet, Take 1 tablet (100 mg total) by mouth once for 1 dose., Disp: 1 tablet, Rfl: 0   Multiple Vitamin (MULTIVITAMIN) tablet, Take 1 tablet by mouth daily., Disp: , Rfl:    naproxen sodium (ALEVE) 220 MG tablet, Take 220 mg by mouth daily as needed., Disp: , Rfl:    Medications ordered in this encounter:  No orders of the defined types were placed in this encounter.    *If you need refills on other medications prior to your next appointment, please contact your pharmacy*  Follow-Up: Call back or seek an in-person evaluation if the symptoms worsen or if the condition fails to improve as anticipated.  [facility] [phone]  Other Instructions Take antibiotic (Azithromycin) as directed.  Increase fluids.  Get plenty of rest. Use Mucinex for congestion. Use the Tessalon and Flonase per orders. Take a daily probiotic (I recommend Align or Culturelle, but  even Activia Yogurt may be beneficial).  A humidifier placed in the bedroom may offer some relief for a dry, scratchy throat of nasal irritation.  Read information below on acute bronchitis. Please call or return to clinic if symptoms are not improving.  Acute Bronchitis Bronchitis is when the airways that extend from the windpipe into the lungs get red, puffy, and painful (inflamed). Bronchitis often causes thick spit (mucus) to develop. This leads to a cough. A cough is the most common symptom of bronchitis. In acute bronchitis, the condition usually begins suddenly and goes away over time (usually in 2 weeks). Smoking, allergies, and asthma can make bronchitis worse. Repeated episodes of bronchitis may cause more lung problems.  HOME CARE Rest. Drink enough fluids to keep your pee (urine) clear or pale yellow (unless you need to limit fluids as told by your doctor). Only take over-the-counter or prescription medicines as told by your doctor. Avoid smoking and secondhand smoke. These can make bronchitis worse. If you are a smoker, think about using nicotine gum or skin patches. Quitting smoking will help your lungs heal faster. Reduce the chance of getting bronchitis again by: Washing your hands often. Avoiding people with cold symptoms. Trying not to touch your hands to your mouth, nose, or eyes. Follow up with your doctor as told.  GET HELP IF: Your symptoms do not improve after 1 week of treatment. Symptoms include: Cough. Fever. Coughing up thick spit. Body aches. Chest congestion. Chills. Shortness of breath. Sore throat.  GET HELP RIGHT  AWAY IF:  You have an increased fever. You have chills. You have severe shortness of breath. You have bloody thick spit (sputum). You throw up (vomit) often. You lose too much body fluid (dehydration). You have a severe headache. You faint.  MAKE SURE YOU:  Understand these instructions. Will watch your condition. Will get help right  away if you are not doing well or get worse. Document Released: 08/03/2007 Document Revised: 10/17/2012 Document Reviewed: 08/07/2012 [facility] Patient Information 2015 [place], [place]. This information is not intended to replace advice given to you by your health care provider. Make sure you discuss any questions you have with your health care provider.    If you have been instructed to have an in-person evaluation today at a local Urgent Care facility, please use the link below. It will take you to a list of all of our available [facility], including address, phone number and hours of operation. Please do not delay care.  [facility]  If you or a family member do not have a primary care provider, use the link below to schedule a visit and establish care. When you choose a [facility] primary care physician or advanced practice provider, you gain a long-term partner in health. Find a Primary Care Provider  Learn more about [facility]'s in-office and virtual care options:  - Get Care Now

## 2023-03-16 NOTE — Progress Notes (Signed)
Virtual Visit Consent   Prish Trimper, you are scheduled for a virtual visit with a [facility] provider today. Just as with appointments in the office, your consent must be obtained to participate. Your consent will be active for this visit and any virtual visit you may have with one of our providers in the next 365 days. If you have a MyChart account, a copy of this consent can be sent to you electronically.  As this is a virtual visit, video technology does not allow for your provider to perform a traditional examination. This may limit your provider's ability to fully assess your condition. If your provider identifies any concerns that need to be evaluated in person or the need to arrange testing (such as labs, EKG, etc.), we will make arrangements to do so. Although advances in technology are sophisticated, we cannot ensure that it will always work on either your end or our end. If the connection with a video visit is poor, the visit may have to be switched to a telephone visit. With either a video or telephone visit, we are not always able to ensure that we have a secure connection.  By engaging in this virtual visit, you consent to the provision of healthcare and authorize for your insurance to be billed (if applicable) for the services provided during this visit. Depending on your insurance coverage, you may receive a charge related to this service.  I need to obtain your verbal consent now. Are you willing to proceed with your visit today? [name] has provided verbal consent on 03/16/2023 for a virtual visit (video or telephone). Piedad Climes, [place]  Date: 03/16/2023 2:29 PM  Virtual Visit via Video Note   I, Piedad Climes, connected with  [name]  ([id number], 1950-07-16) on 03/16/23 at  2:30 PM EST by a video-enabled telemedicine application and verified that I am speaking with the correct person using two identifiers.  Location: Patient: Virtual Visit Location  Patient: Home Provider: Virtual Visit Location Provider: Home Office   I discussed the limitations of evaluation and management by telemedicine and the availability of in person appointments. The patient expressed understanding and agreed to proceed.    History of Present Illness: [name] is a 73 y.o. who identifies as a male who was assigned male at birth, and is being seen today for several days of worsening chest congestion and cough that is productive of some phlegm. Starting yesterday noting some associated sinus pressure and discomfort. Denies fever, chills. Denies chest pain or SOB at rest. Denies recent travel or sick contact.    HPI: HPI  Problems:  Patient Active Problem List   Diagnosis Date Noted   Syncope 08/24/2022   Increased heart rate 08/24/2022   Tinnitus 05/12/2022   Episodic peripheral vertigo 05/12/2022   Medicare annual wellness visit, subsequent 11/23/2020   Coronary artery calcification seen on CT scan 03/19/2017   Aortic atherosclerosis (HCC) 03/19/2017   Essential hypertension 06/06/2016   Elevated PSA 06/16/2011   Encounter for general adult medical examination with abnormal findings 06/16/2011   Prediabetes 06/14/2011   Prostate cancer screening 06/14/2011   Hyperlipidemia 09/08/2008   ADENOCARCINOMA, RECTUM 03/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   NEPHROLITHIASIS, HX OF 03/08/2007    Allergies:  Allergies  Allergen Reactions   Atorvastatin Other (See Comments)    REACTION: elevated transamines   Pravastatin Sodium Other (See Comments)    REACTION: mood problems/irritability/personality change   Pravastatin Sodium Other (See Comments)    REACTION: mood problems/irritability/personality change  Medications:  Current Outpatient Medications:    Cholecalciferol 125 MCG (5000 UT) TABS, Take 5,000 Units by mouth daily., Disp: , Rfl:    Loperamide HCl (IMODIUM PO), Take by mouth daily as needed., Disp: , Rfl:    losartan (COZAAR) 50 MG tablet, Take 1 tablet (50 mg total)  by mouth in the morning and at bedtime., Disp: 180 tablet, Rfl: 1   Multiple Vitamin (MULTIVITAMIN) tablet, Take 1 tablet by mouth daily., Disp: , Rfl:    naproxen sodium (ALEVE) 220 MG tablet, Take 220 mg by mouth daily as needed., Disp: , Rfl:   Observations/Objective: Patient is well-developed, well-nourished in no acute distress.  Resting comfortably at home.  Head is normocephalic, atraumatic.  No labored breathing. Speech is clear and coherent with logical content.  Patient is alert and oriented at baseline.   Assessment and Plan: 1. Bacterial URI (Primary)  Rx Azithromycin.  Increase fluids.  Rest.  Saline nasal spray.  Probiotic.  Mucinex as directed.  Humidifier in bedroom. Tessalon and Flonase per orders.  Call or return to clinic if symptoms are not improving.   Follow Up Instructions: I discussed the assessment and treatment plan with the patient. The patient was provided an opportunity to ask questions and all were answered. The patient agreed with the plan and demonstrated an understanding of the instructions.  A copy of instructions were sent to the patient via MyChart unless otherwise noted below.   The patient was advised to call back or seek an in-person evaluation if the symptoms worsen or if the condition fails to improve as anticipated.    Piedad Climes, PA-C

## 2023-04-12 ENCOUNTER — Other Ambulatory Visit: Payer: Self-pay | Admitting: Family Medicine

## 2023-07-08 ENCOUNTER — Other Ambulatory Visit: Payer: Self-pay | Admitting: Family Medicine

## 2023-07-10 ENCOUNTER — Telehealth: Payer: Self-pay | Admitting: Family Medicine

## 2023-07-10 NOTE — Telephone Encounter (Signed)
 Last CPE was 05/11/22,   Please schedule CPE (labs prior) then route back to me to refill

## 2023-07-10 NOTE — Telephone Encounter (Signed)
 Copied from CRM [id number]. Topic: Clinical - Medication Refill >> Jul 10, 2023  9:17 AM Zipporah Him wrote: Medication: losartan  (COZAAR ) 50 MG tablet  Has the patient contacted their pharmacy? Yes No refills   This is the patient's preferred pharmacy:  [facility] [address] [place], [place] - [address] AT NEXT TO [facility] [address] [place] [address] Phone: [phone] Fax: [phone]  Is this the correct pharmacy for this prescription? Yes If no, delete pharmacy and type the correct one.   Has the prescription been filled recently? No  Is the patient out of the medication? No  Has the patient been seen for an appointment in the last year OR does the patient have an upcoming appointment? Yes  Can we respond through MyChart? Yes  Agent: Please be advised that Rx refills may take up to 3 business days. We ask that you follow-up with your pharmacy.

## 2023-07-10 NOTE — Telephone Encounter (Signed)
 lvm for pt to call office to schedule appt.

## 2023-07-11 ENCOUNTER — Telehealth: Payer: Self-pay | Admitting: Family Medicine

## 2023-07-11 ENCOUNTER — Ambulatory Visit (INDEPENDENT_AMBULATORY_CARE_PROVIDER_SITE_OTHER): Payer: Medicare Other

## 2023-07-11 VITALS — Ht 72.0 in | Wt 185.0 lb

## 2023-07-11 DIAGNOSIS — Z0000 Encounter for general adult medical examination without abnormal findings: Secondary | ICD-10-CM

## 2023-07-11 DIAGNOSIS — I10 Essential (primary) hypertension: Secondary | ICD-10-CM

## 2023-07-11 DIAGNOSIS — E782 Mixed hyperlipidemia: Secondary | ICD-10-CM

## 2023-07-11 DIAGNOSIS — Z125 Encounter for screening for malignant neoplasm of prostate: Secondary | ICD-10-CM

## 2023-07-11 DIAGNOSIS — R7303 Prediabetes: Secondary | ICD-10-CM

## 2023-07-11 DIAGNOSIS — R972 Elevated prostate specific antigen [PSA]: Secondary | ICD-10-CM

## 2023-07-11 NOTE — Telephone Encounter (Signed)
 CPE scheduled for 08/09/23, please see prev message

## 2023-07-11 NOTE — Telephone Encounter (Signed)
 Called  pt and schedule a appt for cpe/ pt would like for his lab orders to be send to him so he can take out of town and get them done

## 2023-07-11 NOTE — Progress Notes (Signed)
 Subjective:   [name] is a 73 y.o. who presents for a Medicare Wellness preventive visit.  As a reminder, Annual Wellness Visits don't include a physical exam, and some assessments may be limited, especially if this visit is performed virtually. We may recommend an in-person visit if needed.  Visit Complete: Virtual I connected with  [name] on 07/11/23 by a audio enabled telemedicine application and verified that I am speaking with the correct person using two identifiers.  Patient Location: Home  Provider Location: Office/Clinic  I discussed the limitations of evaluation and management by telemedicine. The patient expressed understanding and agreed to proceed.  Vital Signs: Because this visit was a virtual/telehealth visit, some criteria may be missing or patient reported. Any vitals not documented were not able to be obtained and vitals that have been documented are patient reported.  VideoDeclined- This patient declined Librarian, academic. Therefore the visit was completed with audio only.  Persons Participating in Visit: Patient.  AWV Questionnaire: Yes: Patient Medicare AWV questionnaire was completed by the patient on 07/11/23; I have confirmed that all information answered by patient is correct and no changes since this date.  Cardiac Risk Factors include: advanced age (>37men, >52 women);dyslipidemia;hypertension;male gender     Objective:     Today's Vitals   07/11/23 1540  Weight: 185 lb (83.9 kg)  Height: 6' (1.829 m)   Body mass index is 25.09 kg/m.     07/11/2023    3:47 PM 05/10/2022    2:23 PM 08/01/2019    3:29 PM 07/16/2018    8:09 AM 07/03/2017    9:41 AM 04/12/2017    7:14 AM 11/15/2016    1:41 AM  Advanced Directives  Does Patient Have a Medical Advance Directive? Yes Yes Yes Yes Yes Yes No  Type of Estate agent of [place];Living will Healthcare Power of [name];Living will Healthcare Power  of [place];Living will Healthcare Power of [place];Living will;Out of facility DNR (pink MOST or yellow form) Healthcare Power of [place];Living will Healthcare Power of Attorney   Copy of Healthcare Power of Attorney in Chart? Yes - validated most recent copy scanned in chart (See row information) No - copy requested No - copy requested No - copy requested No - copy requested      Current Medications (verified) Outpatient Encounter Medications as of 07/11/2023  Medication Sig   Cholecalciferol 125 MCG (5000 UT) TABS Take 5,000 Units by mouth daily.   fluticasone  (FLONASE ) 50 MCG/ACT nasal spray Place 2 sprays into both nostrils daily.   losartan  (COZAAR ) 50 MG tablet TAKE 1 TABLET (50 MG) BY MOUTH IN THE MORNING AND AT BEDTIME   Multiple Vitamin (MULTIVITAMIN) tablet Take 1 tablet by mouth daily.   naproxen  sodium (ALEVE ) 220 MG tablet Take 220 mg by mouth daily as needed.   benzonatate  (TESSALON ) 100 MG capsule Take 1 capsule (100 mg total) by mouth 3 (three) times daily as needed for cough. (Patient not taking: Reported on 07/11/2023)   Loperamide HCl (IMODIUM PO) Take by mouth daily as needed.   No facility-administered encounter medications on file as of 07/11/2023.    Allergies (verified) Atorvastatin, Pravastatin sodium, and Pravastatin sodium   History: Past Medical History:  Diagnosis Date   Diverticulosis    Hyperlipidemia    Hypertension    borderline not on meds at this time   Nephrolithiasis    rectal ca dx'd 2005   rectal   Past Surgical History:  Procedure Laterality  Date   COLONOSCOPY     enlarged prostate  08/2003   on CT scan   GANGLION CYST EXCISION Right 05/2016   GUM SURGERY  05/2018   cyst removal - benign   LOW ANTERIOR BOWEL RESECTION     Family History  Problem Relation Age of Onset   Cancer Father 64       colon   Colon cancer Father    Colon polyps Father    Cancer Brother        colon   Colon cancer Brother    Colon polyps Brother     Esophageal cancer Neg Hx    Rectal cancer Neg Hx    Stomach cancer Neg Hx    Social History   Socioeconomic History   Marital status: Married    Spouse name: Not on file   Number of children: Not on file   Years of education: Not on file   Highest education level: Associate degree: occupational, Scientist, product/process development, or vocational program  Occupational History   Occupation: Lexicographer: [employer]  Tobacco Use   Smoking status: Former    Current packs/day: 1.00    Types: Cigarettes   Smokeless tobacco: Never   Tobacco comments:    quit 23+years ago  Vaping Use   Vaping status: Never Used  Substance and Sexual Activity   Alcohol use: Yes    Alcohol/week: 7.0 standard drinks of alcohol    Types: 3 Glasses of wine, 4 Standard drinks or equivalent per week    Comment: daily   Drug use: No   Sexual activity: Not Currently  Other Topics Concern   Not on file  Social History Narrative   Not on file   Social Drivers of Health   Financial Resource Strain: Low Risk  (07/11/2023)   Overall Financial Resource Strain (CARDIA)    Difficulty of Paying Living Expenses: Not hard at all  Food Insecurity: No Food Insecurity (07/11/2023)   Hunger Vital Sign    Worried About Running Out of Food in the Last Year: Never true    Ran Out of Food in the Last Year: Never true  Transportation Needs: No Transportation Needs (07/11/2023)   PRAPARE - Administrator, Civil Service (Medical): No    Lack of Transportation (Non-Medical): No  Physical Activity: Insufficiently Active (07/11/2023)   Exercise Vital Sign    Days of Exercise per Week: 3 days    Minutes of Exercise per Session: 40 min  Stress: No Stress Concern Present (07/11/2023)   [employer] of Occupational Health - Occupational Stress Questionnaire    Feeling of Stress : Not at all  Social Connections: Moderately Isolated (07/11/2023)   Social Connection and Isolation Panel [NHANES]    Frequency of  Communication with Friends and Family: Three times a week    Frequency of Social Gatherings with Friends and Family: Twice a week    Attends Religious Services: Never    Database administrator or Organizations: No    Attends Engineer, structural: Not on file    Marital Status: Married    Tobacco Counseling Counseling given: Not Answered Tobacco comments: quit 23+years ago    Clinical Intake:  Pre-visit preparation completed: Yes  Pain : No/denies pain     BMI - recorded: 25.09 Nutritional Status: BMI 25 -29 Overweight Nutritional Risks: None Diabetes: No  Lab Results  Component Value Date   HGBA1C 5.9 (A) 08/09/2022   HGBA1C  6.7 (H) 05/05/2022   HGBA1C 6.3 11/23/2020     How often do you need to have someone help you when you read instructions, pamphlets, or other written materials from your doctor or pharmacy?: 1 - Never  Interpreter Needed?: No  Comments: lives with wife Information entered by :: B.Evanell Redlich,LPN   Activities of Daily Living     07/11/2023    9:32 AM  In your present state of health, do you have any difficulty performing the following activities:  Hearing? 0  Vision? 0  Difficulty concentrating or making decisions? 0  Walking or climbing stairs? 0  Dressing or bathing? 0  Doing errands, shopping? 0  Preparing Food and eating ? N  Using the Toilet? N  In the past six months, have you accidently leaked urine? N  Do you have problems with loss of bowel control? N  Managing your Medications? N  Managing your Finances? N  Housekeeping or managing your Housekeeping? N    Patient Care Team: Tower, Manley Seeds, MD as PCP - General  Indicate any recent Medical Services you may have received from other than [facility] providers in the past year (date may be approximate).     Assessment:    This is a routine wellness examination for Ezri.  Hearing/Vision screen Hearing Screening - Comments:: Pt says he is hearing ok.. Vision Screening -  Comments:: Pt says uses readers and vision is good No eye md is awhile:declines   Goals Addressed             This Visit's Progress    Patient Stated   On track    07/11/23- I will continue to walk 2 miles 2-3 times a week.      COMPLETED: Patient Stated       Get in better condition, be more active.       Depression Screen     07/11/2023    3:45 PM 08/24/2022    8:55 AM 08/09/2022    9:22 AM 05/10/2022    2:20 PM 11/23/2020   10:51 AM 08/01/2019    3:30 PM 07/16/2018    8:09 AM  PHQ 2/9 Scores  PHQ - 2 Score 0 0 0 0 0 0 0  PHQ- 9 Score  0 0   0 0    Fall Risk     07/11/2023    9:32 AM 08/24/2022    8:55 AM 08/09/2022    9:21 AM 05/10/2022    2:14 PM 11/23/2020   10:59 AM  Fall Risk   Falls in the past year? 0 0 0 1 0  Number falls in past yr: 0 0 0 0 0  Injury with Fall? 0 0 0 1 0  Comment    fell stepping off ladder landed on shoulder.   Risk for fall due to : No Fall Risks No Fall Risks No Fall Risks No Fall Risks   Follow up Education provided;Falls prevention discussed Falls evaluation completed Falls evaluation completed Falls prevention discussed;Falls evaluation completed Falls evaluation completed    MEDICARE RISK AT HOME:  Medicare Risk at Home Any stairs in or around the home?: (Patient-Rptd) Yes If so, are there any without handrails?: Yes Home free of loose throw rugs in walkways, pet beds, electrical cords, etc?: (Patient-Rptd) Yes Adequate lighting in your home to reduce risk of falls?: (Patient-Rptd) Yes Life alert?: (Patient-Rptd) No Use of a cane, walker or w/c?: (Patient-Rptd) No Grab bars in the bathroom?: (Patient-Rptd) No Shower chair or  bench in shower?: (Patient-Rptd) Yes Elevated toilet seat or a handicapped toilet?: (Patient-Rptd) No  TIMED UP AND GO:  Was the test performed?  No  Cognitive Function: 6CIT completed    08/01/2019    3:31 PM 07/16/2018    8:09 AM 07/03/2017    9:42 AM 06/29/2016    2:52 PM  MMSE - Mini Mental State Exam   Orientation to time 5 5 5 5   Orientation to Place 5 5 5 5   Registration 3 3 3 3   Attention/ Calculation 5 0 0 0  Recall 3 3 3 3   Language- name 2 objects  0 0 0  Language- repeat 1 1 1 1   Language- follow 3 step command  0 3 3  Language- read & follow direction  0 0 0  Write a sentence  0 0 0  Copy design  0 0 0  Total score  17 20 20         07/11/2023    3:48 PM 05/10/2022    2:24 PM  6CIT Screen  What Year? 0 points 0 points  What month? 0 points 0 points  What time? 0 points 0 points  Count back from 20 0 points 0 points  Months in reverse 0 points 0 points  Repeat phrase 0 points 0 points  Total Score 0 points 0 points    Immunizations Immunization History  Administered Date(s) Administered   Influenza, High Dose Seasonal PF 01/11/2018   Influenza,inj,Quad PF,6+ Mos 12/30/2016   Moderna Covid-19 Vaccine Bivalent Booster 81yrs & up 12/21/2020   Moderna Sars-Covid-2 Vaccination 03/21/2019, 04/19/2019, 12/23/2019   Pneumococcal Conjugate-13 06/06/2016   Pneumococcal Polysaccharide-23 07/03/2017   Td 07/07/2003, 07/12/2016   Zoster Recombinant(Shingrix) 11/24/2020, 01/27/2021    Screening Tests Health Maintenance  Topic Date Due   COVID-19 Vaccine (5 - 2024-25 season) 10/30/2022   INFLUENZA VACCINE  09/29/2023   Medicare Annual Wellness (AWV)  07/10/2024   DTaP/Tdap/Td (3 - Tdap) 07/13/2026   Colonoscopy  06/16/2027   Pneumonia Vaccine 17+ Years old  Completed   Hepatitis C Screening  Completed   Zoster Vaccines- Shingrix  Completed   HPV VACCINES  Aged Out   Meningococcal B Vaccine  Aged Out    Health Maintenance  Health Maintenance Due  Topic Date Due   COVID-19 Vaccine (5 - 2024-25 season) 10/30/2022   Health Maintenance Items Addressed: Patient declined referral to establish with an ophthalmologist.    Additional Screening:  Vision Screening: Recommended annual ophthalmology exams for early detection of glaucoma and other disorders of the  eye.  Dental Screening: Recommended annual dental exams for proper oral hygiene  Community Resource Referral / Chronic Care Management: CRR required this visit?  No   CCM required this visit?  No   Plan:    I have personally reviewed and noted the following in the patient's chart:   Medical and social history Use of alcohol, tobacco or illicit drugs  Current medications and supplements including opioid prescriptions. Patient is not currently taking opioid prescriptions. Functional ability and status Nutritional status Physical activity Advanced directives List of other physicians Hospitalizations, surgeries, and ER visits in previous 12 months Vitals Screenings to include cognitive, depression, and falls Referrals and appointments  In addition, I have reviewed and discussed with patient certain preventive protocols, quality metrics, and best practice recommendations. A written personalized care plan for preventive services as well as general preventive health recommendations were provided to patient.   Nerissa Bannister, LPN  07/11/2023   After Visit Summary: (MyChart) Due to this being a telephonic visit, the after visit summary with patients personalized plan was offered to patient via MyChart   Notes: Nothing significant to report at this time.

## 2023-07-11 NOTE — Telephone Encounter (Signed)
 Labs ordered for [facility] collect

## 2023-07-11 NOTE — Patient Instructions (Signed)
 Mr. [name] , Thank you for taking time out of your busy schedule to complete your Annual Wellness Visit with me. I enjoyed our conversation and look forward to speaking with you again next year. I, as well as your care team,  appreciate your ongoing commitment to your health goals. Please review the following plan we discussed and let me know if I can assist you in the future. Your Game plan/ To Do List     Follow up Visits: Next Medicare AWV with our clinical staff: 07/11/24 @11 :30am televisit   Have you seen your provider in the last 6 months (3 months if uncontrolled diabetes)? No Next Office Visit with your provider: 6/3  Clinician Recommendations:  Aim for 30 minutes of exercise or brisk walking, 6-8 glasses of water, and 5 servings of fruits and vegetables each day.       This is a list of the screening recommended for you and due dates:  Health Maintenance  Topic Date Due   COVID-19 Vaccine (5 - 2024-25 season) 10/30/2022   Flu Shot  09/29/2023   Medicare Annual Wellness Visit  07/10/2024   DTaP/Tdap/Td vaccine (3 - Tdap) 07/13/2026   Colon Cancer Screening  06/16/2027   Pneumonia Vaccine  Completed   Hepatitis C Screening  Completed   Zoster (Shingles) Vaccine  Completed   HPV Vaccine  Aged Out   Meningitis B Vaccine  Aged Out    Advanced directives: (Copy Requested) Please bring a copy of your health care power of attorney and living will to the office to be added to your chart at your convenience. You can mail to [facility] [address] [place], [place] [address] or email to [email or web address] Advance Care Planning is important because it:  [x]  Makes sure you receive the medical care that is consistent with your values, goals, and preferences  [x]  It provides guidance to your family and loved ones and reduces their decisional burden about whether or not they are making the right decisions based on your wishes.  Follow the link provided in your  after visit summary or read over the paperwork we have mailed to you to help you started getting your Advance Directives in place. If you need assistance in completing these, please reach out to us  so that we can help you!

## 2023-07-11 NOTE — Telephone Encounter (Signed)
 Pt would like to do his lab work to the lab core in [place] would like for Dr tower to send him the order pt is going out of town and want be back until the in of May

## 2023-07-11 NOTE — Telephone Encounter (Signed)
 Orders printed and mailed to pt. Pt advised

## 2023-07-17 LAB — LAB REPORT - SCANNED: EGFR: 60

## 2023-07-19 ENCOUNTER — Telehealth: Payer: Self-pay

## 2023-07-19 NOTE — Transitions of Care (Post Inpatient/ED Visit) (Signed)
 Unable to reach patient by phone and left v/m requesting call back at [phone].       07/19/2023  Name: [name] MRN: [id number] DOB: [birth year]  Today's TOC FU Call Status: Today's TOC FU Call Status:: Unsuccessful Call (1st Attempt) Unsuccessful Call (1st Attempt) Date: 07/19/23  Attempted to reach the patient regarding the most recent Inpatient/ED visit.  Follow Up Plan: Additional outreach attempts will be made to reach the patient to complete the Transitions of Care (Post Inpatient/ED visit) call.   Signature  Claretha Crocker, LPN

## 2023-07-20 NOTE — Transitions of Care (Post Inpatient/ED Visit) (Signed)
 I spoke with pt who was seen at [facility] Med center ED on 07/17/23 due to watery diarrhea; pt was given immodium and zofran  and Augmentin. pt is still taking the augmentin. pt said was dehydrated and got IV fluids at ED.pt is feeling better.pt is not having any nausea now and pt is able to take in food and liquids. Pt already has appt on 08/09/23 with Dr Malissa Se and pt does not want sooner appt.UC & ED precautions given and pt voiced understanding.Sending note to DR [employer].     07/20/2023  Name: [name] MRN: [id number] DOB: [birth year]  Today's TOC FU Call Status: Today's TOC FU Call Status:: Successful TOC FU Call Completed Unsuccessful Call (1st Attempt) Date: 07/19/23 [facility] FU Call Complete Date: 07/20/23 Patient's Name and Date of Birth confirmed.  Transition Care Management Follow-up Telephone Call Date of Discharge: 07/17/23 Discharge Facility: Other [facility]) Name of Other (Non[facility]) Discharge Facility: [facility] ED Type of Discharge: Emergency Department Reason for ED Visit: Other: (I spoke with pt who was seen at [facility] ED on 07/17/23 due to watery diarrhea; pt was given immodium and zofran  and Augmentin. pt is still taking the augmentin. pt said was dehydrated and got IV fluids at ED.pt is feeling better.) How have you been since you were released from the hospital?: Better Any questions or concerns?: No  Items Reviewed: Did you receive and understand the discharge instructions provided?: Yes Medications obtained,verified, and reconciled?: Yes (Medications Reviewed) Any new allergies since your discharge?: No Dietary orders reviewed?: NA Do you have support at home?: Yes People in Home [RPT]: spouse Name of Support/Comfort Primary Source: Art Bigness  Medications Reviewed Today: Medications Reviewed Today     Reviewed by Katheleen Palmer, LPN (Licensed Practical Nurse) on 07/20/23 at 1141  Med List Status: <None>    Medication Order Taking? Sig Documenting Provider Last Dose Status Informant  benzonatate  (TESSALON ) 100 MG capsule [id number] No Take 1 capsule (100 mg total) by mouth 3 (three) times daily as needed for cough.  Patient not taking: Reported on 07/11/2023   Myrick, Mcnairy Not Taking Active   Cholecalciferol 125 MCG (5000 UT) TABS [id number] No Take 5,000 Units by mouth daily. [provider] Taking Active   fluticasone  (FLONASE ) 50 MCG/ACT nasal spray [id number] No Place 2 sprays into both nostrils daily. Farris Hong, PA-C Taking Active            Med Note Woodward Head, BRENDA L   Tue Jul 11, 2023  3:45 PM) prn  Loperamide HCl (IMODIUM PO) [id number] No Take by mouth daily as needed. [provider] Taking Active   losartan  (COZAAR ) 50 MG tablet [id number] No TAKE 1 TABLET (50 MG) BY MOUTH IN THE MORNING AND AT BEDTIME Tower, Manley Seeds, MD Taking Active   Multiple Vitamin (MULTIVITAMIN) tablet [id number] No Take 1 tablet by mouth daily. [provider] Taking Active Self  naproxen  sodium (ALEVE ) 220 MG tablet [id number] No Take 220 mg by mouth daily as needed. [provider] Taking Active            Med Note Woodward Head, BRENDA L   Tue Jul 11, 2023  3:45 PM) prn            Home Care and Equipment/Supplies: Were Home Health Services Ordered?: NA Any new equipment or medical supplies ordered?: NA  Functional Questionnaire: Do you need assistance with bathing/showering or dressing?: No Do you  need assistance with meal preparation?: No Do you need assistance with eating?: No Do you have difficulty maintaining continence: No Do you need assistance with getting out of bed/getting out of a chair/moving?: No Do you have difficulty managing or taking your medications?: No  Follow up appointments reviewed: PCP Follow-up appointment confirmed?: Yes Date of PCP follow-up appointment?: 08/09/23 Follow-up Provider: Dr Mitchell [facility]  Follow-up appointment confirmed?: NA Do you need transportation to your follow-up appointment?: No Do you understand care options if your condition(s) worsen?: Yes-patient verbalized understanding    SIGNATURE Claretha Crocker, LPN

## 2023-08-03 LAB — CBC WITH DIFFERENTIAL/PLATELET
Basophils Absolute: 0.1 10*3/uL (ref 0.0–0.2)
Basos: 1 %
EOS (ABSOLUTE): 0.1 10*3/uL (ref 0.0–0.4)
Eos: 2 %
Hematocrit: 46.5 % (ref 37.5–51.0)
Hemoglobin: 14.9 g/dL (ref 13.0–17.7)
Immature Grans (Abs): 0 10*3/uL (ref 0.0–0.1)
Immature Granulocytes: 0 %
Lymphocytes Absolute: 1.8 10*3/uL (ref 0.7–3.1)
Lymphs: 29 %
MCH: 30 pg (ref 26.6–33.0)
MCHC: 32 g/dL (ref 31.5–35.7)
MCV: 94 fL (ref 79–97)
Monocytes Absolute: 0.5 10*3/uL (ref 0.1–0.9)
Monocytes: 7 %
Neutrophils Absolute: 3.8 10*3/uL (ref 1.4–7.0)
Neutrophils: 61 %
Platelets: 319 10*3/uL (ref 150–450)
RBC: 4.97 x10E6/uL (ref 4.14–5.80)
RDW: 12.5 % (ref 11.6–15.4)
WBC: 6.2 10*3/uL (ref 3.4–10.8)

## 2023-08-03 LAB — COMPREHENSIVE METABOLIC PANEL WITH GFR
ALT: 20 IU/L (ref 0–44)
AST: 21 IU/L (ref 0–40)
Albumin: 4.3 g/dL (ref 3.8–4.8)
Alkaline Phosphatase: 91 IU/L (ref 44–121)
BUN/Creatinine Ratio: 13 (ref 10–24)
BUN: 13 mg/dL (ref 8–27)
Bilirubin Total: 0.6 mg/dL (ref 0.0–1.2)
CO2: 21 mmol/L (ref 20–29)
Calcium: 9.7 mg/dL (ref 8.6–10.2)
Chloride: 102 mmol/L (ref 96–106)
Creatinine, Ser: 1.02 mg/dL (ref 0.76–1.27)
Globulin, Total: 2.9 g/dL (ref 1.5–4.5)
Glucose: 120 mg/dL — ABNORMAL HIGH (ref 70–99)
Potassium: 4.5 mmol/L (ref 3.5–5.2)
Sodium: 141 mmol/L (ref 134–144)
Total Protein: 7.2 g/dL (ref 6.0–8.5)
eGFR: 78 mL/min/{1.73_m2} (ref >59)

## 2023-08-03 LAB — HEMOGLOBIN A1C
Est. average glucose Bld gHb Est-mCnc: 143 mg/dL
Hgb A1c MFr Bld: 6.6 % — ABNORMAL HIGH (ref 4.8–5.6)

## 2023-08-03 LAB — TSH: TSH: 1.62 u[IU]/mL (ref 0.450–4.500)

## 2023-08-03 LAB — LIPID PANEL
Chol/HDL Ratio: 6.5 ratio — ABNORMAL HIGH (ref 0.0–5.0)
Cholesterol, Total: 285 mg/dL — ABNORMAL HIGH (ref 100–199)
HDL: 44 mg/dL (ref >39)
LDL Chol Calc (NIH): 203 mg/dL — ABNORMAL HIGH (ref 0–99)
Triglycerides: 197 mg/dL — ABNORMAL HIGH (ref 0–149)
VLDL Cholesterol Cal: 38 mg/dL (ref 5–40)

## 2023-08-03 LAB — PSA: Prostate Specific Ag, Serum: 10.9 ng/mL — ABNORMAL HIGH (ref 0.0–4.0)

## 2023-08-09 ENCOUNTER — Ambulatory Visit (INDEPENDENT_AMBULATORY_CARE_PROVIDER_SITE_OTHER): Admitting: Family Medicine

## 2023-08-09 ENCOUNTER — Encounter: Payer: Self-pay | Admitting: Family Medicine

## 2023-08-09 VITALS — BP 138/70 | HR 63 | Temp 97.8°F | Ht 71.25 in | Wt 181.5 lb

## 2023-08-09 DIAGNOSIS — E782 Mixed hyperlipidemia: Secondary | ICD-10-CM

## 2023-08-09 DIAGNOSIS — Z0000 Encounter for general adult medical examination without abnormal findings: Secondary | ICD-10-CM | POA: Diagnosis not present

## 2023-08-09 DIAGNOSIS — I2510 Atherosclerotic heart disease of native coronary artery without angina pectoris: Secondary | ICD-10-CM

## 2023-08-09 DIAGNOSIS — I10 Essential (primary) hypertension: Secondary | ICD-10-CM

## 2023-08-09 DIAGNOSIS — R7303 Prediabetes: Secondary | ICD-10-CM

## 2023-08-09 DIAGNOSIS — Z125 Encounter for screening for malignant neoplasm of prostate: Secondary | ICD-10-CM

## 2023-08-09 DIAGNOSIS — R972 Elevated prostate specific antigen [PSA]: Secondary | ICD-10-CM

## 2023-08-09 DIAGNOSIS — I700 Atherosclerosis of aorta: Secondary | ICD-10-CM

## 2023-08-09 NOTE — Assessment & Plan Note (Addendum)
 Reviewed health habits including diet and exercise and skin cancer prevention Reviewed appropriate screening tests for age  Also reviewed health mt list, fam hx and immunization status , as well as social and family history   See HPI Labs reviewed and ordered Health Maintenance  Topic Date Due   COVID-19 Vaccine (5 - 2024-25 season) 08/24/2024*   Flu Shot  09/29/2023   Medicare Annual Wellness Visit  07/10/2024   DTaP/Tdap/Td vaccine (3 - Tdap) 07/13/2026   Colon Cancer Screening  06/16/2027   Pneumonia Vaccine  Completed   Hepatitis C Screening  Completed   Zoster (Shingles) Vaccine  Completed   HPV Vaccine  Aged Out   Meningitis B Vaccine  Aged Out  *Topic was postponed. The date shown is not the original due date.   Elevated psa noted-will send to urology  Discussed fall prevention, supplements and exercise for bone density  PHQ 0 Discussed plan for diet for elevated glucose

## 2023-08-09 NOTE — Assessment & Plan Note (Signed)
 Disc goals for lipids and reasons to control them Rev last labs with pt Rev low sat fat diet in detail Still extremely high with LDL of 203 Has seen cardiology  Per pt decided not to treat with psyk9, based on testing  Intol of statin (increase lft) and declined zetia

## 2023-08-09 NOTE — Assessment & Plan Note (Signed)
 With white coat syndrome BP: 138/70  At home runs in 130s/ 70s much of the time Will hold chlorthalidone  in light of recent syncope-still holding  Continue losartan  50 mg daily  Resting HR is normal   Continues losartan  50 mg daily  Labs reviewed

## 2023-08-09 NOTE — Progress Notes (Signed)
 Subjective:    Patient ID: [name], male    DOB: 24-Apr-1950, 73 y.o.   MRN: [id number]  HPI  Here for health maintenance exam and to review chronic medical problems   Wt Readings from Last 3 Encounters:  08/09/23 181 lb 8 oz (82.3 kg)  07/11/23 185 lb (83.9 kg)  09/27/22 185 lb 6 oz (84.1 kg)   25.14 kg/m  Vitals:   08/09/23 1124 08/09/23 1207  BP: (!) 148/86 138/70  Pulse: 63   Temp: 97.8 F (36.6 C)   SpO2: 98%     Had ER visit for diarrhea and dehydration last month / dehydration  Empiric treatment with augmentin  Also zofran   2 days later took trip to [place]   Feeling much better   Lost 4 lb  179lb at home     Immunization History  Administered Date(s) Administered   Influenza, High Dose Seasonal PF 01/11/2018   Influenza,inj,Quad PF,6+ Mos 12/30/2016   Moderna Covid-19 Vaccine Bivalent Booster 18yrs & up 12/21/2020   Moderna Sars-Covid-2 Vaccination 03/21/2019, 04/19/2019, 12/23/2019   Pneumococcal Conjugate-13 06/06/2016   Pneumococcal Polysaccharide-23 07/03/2017   Td 07/07/2003, 07/12/2016   Zoster Recombinant(Shingrix) 11/24/2020, 01/27/2021    There are no preventive care reminders to display for this patient.   Prostate health History of elevated psa and BPH  Lab Results  Component Value Date   PSA1 10.9 (H) 08/02/2023   PSA 5.87 (H) 06/06/2016   PSA 3.57 06/14/2011   PSA 1.89 09/08/2008   Has had neg prostate bx in past  No change in flow /urination  Flow has been slow for years -no changes No nocturia   Dr Freddi Jaeger- for urology    Colon cancer screening  Colonoscopy 05/2022  5 y recall in setting of past rectal adenocarcinoma   Bone health  Falls-none  Fractures-none  Supplements mvi, vit D   Exercise  Crunches Works with weights  Walking    Mood    07/11/2023    3:45 PM 08/24/2022    8:55 AM 08/09/2022    9:22 AM 05/10/2022    2:20 PM 11/23/2020   10:51 AM  Depression screen PHQ 2/9  Decreased Interest 0 0 0 0  0  Down, Depressed, Hopeless 0 0 0 0 0  PHQ - 2 Score 0 0 0 0 0  Altered sleeping  0 0    Tired, decreased energy  0 0    Change in appetite  0 0    Feeling bad or failure about yourself   0 0    Trouble concentrating  0 0    Moving slowly or fidgety/restless  0 0    Suicidal thoughts  0 0    PHQ-9 Score  0 0    Difficult doing work/chores  Not difficult at all Not difficult at all     HTN  No cp or palpitations or headaches or edema  No side effects to medicines  BP Readings from Last 3 Encounters:  08/09/23 138/70  10/24/22 135/82  09/27/22 (!) 150/90    Pt's cuff runs higher than ours today at 165/100  Has white coat hypertension in addn to essential htn   At home 120s-130s  134 this am   Losartan  50 mg daily  Past chlorthalidone -stopped after syncope   Pulse Readings from Last 3 Encounters:  08/09/23 63  10/24/22 61  09/27/22 98   Aleve  is on med list   Lab Results  Component Value Date  NA 141 08/02/2023   K 4.5 08/02/2023   CO2 21 08/02/2023   GLUCOSE 120 (H) 08/02/2023   BUN 13 08/02/2023   CREATININE 1.02 08/02/2023   CALCIUM 9.7 08/02/2023   GFR 76.84 11/23/2020   EGFR 78 08/02/2023   GFRNONAA >60 11/15/2016    Hyperlipidemia Lab Results  Component Value Date   CHOL 285 (H) 08/02/2023   CHOL 315 (H) 05/05/2022   CHOL 311 (H) 11/23/2020   Lab Results  Component Value Date   HDL 44 08/02/2023   HDL 46 05/05/2022   HDL 46.80 11/23/2020   Lab Results  Component Value Date   LDLCALC 203 (H) 08/02/2023   LDLCALC 212 (H) 05/05/2022   Lab Results  Component Value Date   TRIG 197 (H) 08/02/2023   TRIG 283 (H) 05/05/2022   TRIG 322.0 (H) 11/23/2020   Lab Results  Component Value Date   CHOLHDL 6.5 (H) 08/02/2023   CHOLHDL 6.8 (H) 05/05/2022   CHOLHDL 7 11/23/2020   Lab Results  Component Value Date   LDLDIRECT 211.0 11/23/2020   LDLDIRECT 216.0 07/03/2017   LDLDIRECT 234.0 12/30/2016   In past statin raised LFT Pt declined zetia   Saw cardiology - good work up  Did not decide to treat     Cutting back on processed meat     Prediabetes Lab Results  Component Value Date   HGBA1C 6.6 (H) 08/02/2023   HGBA1C 5.9 (A) 08/09/2022   HGBA1C 6.7 (H) 05/05/2022   Just had vacation  In the past 3 months has been more mindful of sugar intake  Watching it   Sweets once per week  Sugar drinks- some OJ , occational gatorade  Waffle with syrup once per week  Simple carbs  Some snack foods /not a lot  Toast with breakfast / bread with a meal  No rice  Rarely eats pasta      Lab Results  Component Value Date   WBC 6.2 08/02/2023   HGB 14.9 08/02/2023   HCT 46.5 08/02/2023   MCV 94 08/02/2023   PLT 319 08/02/2023   Lab Results  Component Value Date   TSH 1.620 08/02/2023   Lab Results  Component Value Date   ALT 20 08/02/2023   AST 21 08/02/2023   ALKPHOS 91 08/02/2023   BILITOT 0.6 08/02/2023     Patient Active Problem List   Diagnosis Date Noted   Routine general medical examination at a health care facility 08/09/2023   Tinnitus 05/12/2022   Episodic peripheral vertigo 05/12/2022   Medicare annual wellness visit, subsequent 11/23/2020   Coronary artery calcification seen on CT scan 03/19/2017   Aortic atherosclerosis (HCC) 03/19/2017   Essential hypertension 06/06/2016   Elevated PSA 06/16/2011   Encounter for general adult medical examination with abnormal findings 06/16/2011   Prediabetes 06/14/2011   Prostate cancer screening 06/14/2011   Hyperlipidemia 09/08/2008   History of rectal cancer 03/08/2007   NEPHROLITHIASIS, HX OF 03/08/2007   Past Medical History:  Diagnosis Date   Diverticulosis    Hyperlipidemia    Hypertension    borderline not on meds at this time   Nephrolithiasis    rectal ca dx'd 2005   rectal   Past Surgical History:  Procedure Laterality Date   COLONOSCOPY     enlarged prostate  08/2003   on CT scan   GANGLION CYST EXCISION Right 05/2016   GUM  SURGERY  05/2018   cyst removal - benign   LOW ANTERIOR  BOWEL RESECTION     Social History   Tobacco Use   Smoking status: Former    Current packs/day: 1.00    Types: Cigarettes   Smokeless tobacco: Never   Tobacco comments:    quit 23+years ago  Vaping Use   Vaping status: Never Used  Substance Use Topics   Alcohol use: Yes    Alcohol/week: 7.0 standard drinks of alcohol    Types: 3 Glasses of wine, 4 Standard drinks or equivalent per week    Comment: daily   Drug use: No   Family History  Problem Relation Age of Onset   Cancer Father 57       colon   Colon cancer Father    Colon polyps Father    Cancer Brother        colon   Colon cancer Brother    Colon polyps Brother    Esophageal cancer Neg Hx    Rectal cancer Neg Hx    Stomach cancer Neg Hx    Allergies  Allergen Reactions   Atorvastatin Other (See Comments)    REACTION: elevated transamines   Pravastatin Sodium Other (See Comments)    REACTION: mood problems/irritability/personality change   Pravastatin Sodium Other (See Comments)    REACTION: mood problems/irritability/personality change   Current Outpatient Medications on File Prior to Visit  Medication Sig Dispense Refill   Cholecalciferol 125 MCG (5000 UT) TABS Take 5,000 Units by mouth daily.     Loperamide HCl (IMODIUM PO) Take by mouth daily as needed.     losartan  (COZAAR ) 50 MG tablet TAKE 1 TABLET (50 MG) BY MOUTH IN THE MORNING AND AT BEDTIME 180 tablet 0   Multiple Vitamin (MULTIVITAMIN) tablet Take 1 tablet by mouth daily.     naproxen  sodium (ALEVE ) 220 MG tablet Take 220 mg by mouth daily as needed.     No current facility-administered medications on file prior to visit.    Review of Systems  Constitutional:  Negative for activity change, appetite change, fatigue, fever and unexpected weight change.  HENT:  Negative for congestion, rhinorrhea, sore throat and trouble swallowing.   Eyes:  Negative for pain, redness, itching and visual  disturbance.  Respiratory:  Negative for cough, chest tightness, shortness of breath and wheezing.   Cardiovascular:  Negative for chest pain and palpitations.  Gastrointestinal:  Negative for abdominal pain, blood in stool, constipation, diarrhea and nausea.  Endocrine: Negative for cold intolerance, heat intolerance, polydipsia and polyuria.  Genitourinary:  Negative for difficulty urinating, dysuria, frequency and urgency.  Musculoskeletal:  Negative for arthralgias, joint swelling and myalgias.  Skin:  Negative for pallor and rash.  Neurological:  Negative for dizziness, tremors, weakness, numbness and headaches.  Hematological:  Negative for adenopathy. Does not bruise/bleed easily.  Psychiatric/Behavioral:  Negative for decreased concentration and dysphoric mood. The patient is not nervous/anxious.        Objective:   Physical Exam Constitutional:      General: He is not in acute distress.    Appearance: Normal appearance. He is well-developed and normal weight. He is not ill-appearing or diaphoretic.  HENT:     Head: Normocephalic and atraumatic.     Right Ear: Tympanic membrane, ear canal and external ear normal.     Left Ear: Tympanic membrane, ear canal and external ear normal.     Nose: Nose normal. No congestion.     Mouth/Throat:     Mouth: Mucous membranes are moist.     Pharynx: Oropharynx  is clear. No posterior oropharyngeal erythema.  Eyes:     General: No scleral icterus.       Right eye: No discharge.        Left eye: No discharge.     Conjunctiva/sclera: Conjunctivae normal.     Pupils: Pupils are equal, round, and reactive to light.  Neck:     Thyroid : No thyromegaly.     Vascular: No carotid bruit or JVD.  Cardiovascular:     Rate and Rhythm: Normal rate and regular rhythm.     Pulses: Normal pulses.     Heart sounds: Normal heart sounds.     No gallop.  Pulmonary:     Effort: Pulmonary effort is normal. No respiratory distress.     Breath sounds:  Normal breath sounds. No wheezing or rales.     Comments: Good air exch Chest:     Chest wall: No tenderness.  Abdominal:     General: Bowel sounds are normal. There is no distension or abdominal bruit.     Palpations: Abdomen is soft. There is no mass.     Tenderness: There is no abdominal tenderness.     Hernia: No hernia is present.  Musculoskeletal:        General: No tenderness.     Cervical back: Normal range of motion and neck supple. No rigidity. No muscular tenderness.     Right lower leg: No edema.     Left lower leg: No edema.  Lymphadenopathy:     Cervical: No cervical adenopathy.  Skin:    General: Skin is warm and dry.     Coloration: Skin is not pale.     Findings: No erythema or rash.     Comments: Solar lentigines diffusely Few sks   Neurological:     Mental Status: He is alert.     Cranial Nerves: No cranial nerve deficit.     Motor: No abnormal muscle tone.     Coordination: Coordination normal.     Gait: Gait normal.     Deep Tendon Reflexes: Reflexes are normal and symmetric. Reflexes normal.  Psychiatric:        Mood and Affect: Mood normal.        Cognition and Memory: Cognition and memory normal.           Assessment & Plan:   Problem List Items Addressed This Visit       Cardiovascular and Mediastinum   Essential hypertension   With white coat syndrome BP: 138/70  At home runs in 130s/ 70s much of the time Will hold chlorthalidone  in light of recent syncope-still holding  Continue losartan  50 mg daily  Resting HR is normal   Continues losartan  50 mg daily  Labs reviewed       Coronary artery calcification seen on CT scan   Has seen cardiology regarding this  No symptoms LDL remains very high       Aortic atherosclerosis (HCC)   Cholesterol is still high  Has seen cardiology    No symptoms or clinical changes         Other   Routine general medical examination at a health care facility - Primary   Reviewed health  habits including diet and exercise and skin cancer prevention Reviewed appropriate screening tests for age  Also reviewed health mt list, fam hx and immunization status , as well as social and family history   See HPI Labs reviewed and ordered Health Maintenance  Topic  Date Due   COVID-19 Vaccine (5 - 2024-25 season) 08/24/2024*   Flu Shot  09/29/2023   Medicare Annual Wellness Visit  07/10/2024   DTaP/Tdap/Td vaccine (3 - Tdap) 07/13/2026   Colon Cancer Screening  06/16/2027   Pneumonia Vaccine  Completed   Hepatitis C Screening  Completed   Zoster (Shingles) Vaccine  Completed   HPV Vaccine  Aged Out   Meningitis B Vaccine  Aged Out  *Topic was postponed. The date shown is not the original due date.   Elevated psa noted-will send to urology  Discussed fall prevention, supplements and exercise for bone density  PHQ 0 Discussed plan for diet for elevated glucose        Prostate cancer screening   Lab Results  Component Value Date   PSA1 10.9 (H) 08/02/2023   PSA 5.87 (H) 06/06/2016   PSA 3.57 06/14/2011   PSA 1.89 09/08/2008     Will send to urologist  No clinical changes  Follow up planned in nov with urol       Prediabetes   Prediabetes  A1c just into dm range now -concerning Lab Results  Component Value Date   HGBA1C 6.6 (H) 08/02/2023   HGBA1C 5.9 (A) 08/09/2022   HGBA1C 6.7 (H) 05/05/2022    disc imp of low glycemic diet and wt loss to prevent DM2  Long diet review done Recommendations in AVS Re check in 3 mo (pt lives over 1 h away, [facility])        Relevant Orders   Hemoglobin A1c   Hyperlipidemia   Disc goals for lipids and reasons to control them Rev last labs with pt Rev low sat fat diet in detail Still extremely high with LDL of 203 Has seen cardiology  Per pt decided not to treat with psyk9, based on testing  Intol of statin (increase lft) and declined zetia       Elevated PSA   Lab Results  Component Value  Date   PSA1 10.9 (H) 08/02/2023   PSA 5.87 (H) 06/06/2016   PSA 3.57 06/14/2011   PSA 1.89 09/08/2008    Encouraged follow up with urology Has bph -no clinical change Neg bx in past

## 2023-08-09 NOTE — Assessment & Plan Note (Signed)
 Has seen cardiology regarding this  No symptoms LDL remains very high

## 2023-08-09 NOTE — Assessment & Plan Note (Signed)
 Prediabetes  A1c just into dm range now -concerning Lab Results  Component Value Date   HGBA1C 6.6 (H) 08/02/2023   HGBA1C 5.9 (A) 08/09/2022   HGBA1C 6.7 (H) 05/05/2022    disc imp of low glycemic diet and wt loss to prevent DM2  Long diet review done Recommendations in AVS Re check in 3 mo (pt lives over 1 h away, wants to do at [facility])

## 2023-08-09 NOTE — Assessment & Plan Note (Signed)
 Lab Results  Component Value Date   PSA1 10.9 (H) 08/02/2023   PSA 5.87 (H) 06/06/2016   PSA 3.57 06/14/2011   PSA 1.89 09/08/2008    Encouraged follow up with urology Has bph -no clinical change Neg bx in past

## 2023-08-09 NOTE — Assessment & Plan Note (Signed)
 Cholesterol is still high  Has seen cardiology    No symptoms or clinical changes

## 2023-08-09 NOTE — Patient Instructions (Addendum)
 Try to get most of your carbohydrates from produce (with the exception of white potatoes) and whole grains Eat less bread/pasta/rice/snack foods/cereals/sweets and other items from the middle of the grocery store (processed carbs)  If potato-eat sweet potato instead of white potato  Keep exercising   Get a [facility] appointment where you live after sept 4th from now for Hb A1c test   I will put an order in for that so they have it

## 2023-08-09 NOTE — Assessment & Plan Note (Signed)
 Lab Results  Component Value Date   PSA1 10.9 (H) 08/02/2023   PSA 5.87 (H) 06/06/2016   PSA 3.57 06/14/2011   PSA 1.89 09/08/2008     Will send to urologist  No clinical changes  Follow up planned in nov with urol

## 2023-08-11 NOTE — Addendum Note (Signed)
 Addended by: Queenie Brunet on: 08/11/2023 02:26 PM   Modules accepted: Orders

## 2023-10-06 ENCOUNTER — Other Ambulatory Visit: Payer: Self-pay | Admitting: Family Medicine

## 2023-11-04 LAB — HEMOGLOBIN A1C
Est. average glucose Bld gHb Est-mCnc: 146 mg/dL
Hgb A1c MFr Bld: 6.7 % — ABNORMAL HIGH (ref 4.8–5.6)

## 2023-11-05 ENCOUNTER — Ambulatory Visit: Payer: Self-pay | Admitting: Family Medicine

## 2024-07-11 ENCOUNTER — Ambulatory Visit

## 2024-07-19 ENCOUNTER — Ambulatory Visit
